# Patient Record
Sex: Male | Born: 1950
Health system: Southern US, Community
[De-identification: ages and names within clinical notes are randomized; demographics above are authoritative.]

## PROBLEM LIST (undated history)

## (undated) DIAGNOSIS — Z5189 Encounter for other specified aftercare: Secondary | ICD-10-CM

## (undated) DIAGNOSIS — E119 Type 2 diabetes mellitus without complications: Secondary | ICD-10-CM

## (undated) DIAGNOSIS — H269 Unspecified cataract: Secondary | ICD-10-CM

## (undated) DIAGNOSIS — K760 Fatty (change of) liver, not elsewhere classified: Secondary | ICD-10-CM

## (undated) DIAGNOSIS — I10 Essential (primary) hypertension: Secondary | ICD-10-CM

## (undated) DIAGNOSIS — T7840XA Allergy, unspecified, initial encounter: Secondary | ICD-10-CM

## (undated) DIAGNOSIS — E785 Hyperlipidemia, unspecified: Secondary | ICD-10-CM

## (undated) HISTORY — DX: Essential (primary) hypertension: I10

## (undated) HISTORY — PX: NASAL SINUS SURGERY: SHX719

## (undated) HISTORY — PX: EYE SURGERY: SHX253

## (undated) HISTORY — PX: HEMORROIDECTOMY: SUR656

## (undated) HISTORY — PX: OTHER SURGICAL HISTORY: SHX169

## (undated) HISTORY — DX: Allergy, unspecified, initial encounter: T78.40XA

## (undated) HISTORY — PX: ABDOMINAL SURGERY: SHX537

## (undated) HISTORY — DX: Type 2 diabetes mellitus without complications: E11.9

## (undated) HISTORY — PX: POLYPECTOMY: SHX149

## (undated) HISTORY — DX: Fatty (change of) liver, not elsewhere classified: K76.0

## (undated) HISTORY — DX: Encounter for other specified aftercare: Z51.89

## (undated) HISTORY — DX: Unspecified cataract: H26.9

## (undated) HISTORY — DX: Hyperlipidemia, unspecified: E78.5

---

## 2000-10-21 ENCOUNTER — Emergency Department (HOSPITAL_COMMUNITY): Admission: EM | Admit: 2000-10-21 | Discharge: 2000-10-21 | Payer: Self-pay | Admitting: Emergency Medicine

## 2000-10-21 ENCOUNTER — Encounter: Payer: Self-pay | Admitting: Emergency Medicine

## 2005-03-17 HISTORY — PX: COLONOSCOPY: SHX174

## 2005-04-06 ENCOUNTER — Ambulatory Visit (HOSPITAL_COMMUNITY): Admission: RE | Admit: 2005-04-06 | Discharge: 2005-04-06 | Payer: Self-pay | Admitting: Surgery

## 2005-04-06 ENCOUNTER — Encounter (INDEPENDENT_AMBULATORY_CARE_PROVIDER_SITE_OTHER): Payer: Self-pay | Admitting: Specialist

## 2005-04-07 ENCOUNTER — Ambulatory Visit (HOSPITAL_COMMUNITY): Admission: RE | Admit: 2005-04-07 | Discharge: 2005-04-07 | Payer: Self-pay | Admitting: *Deleted

## 2005-04-07 ENCOUNTER — Encounter (INDEPENDENT_AMBULATORY_CARE_PROVIDER_SITE_OTHER): Payer: Self-pay | Admitting: Specialist

## 2005-04-08 ENCOUNTER — Emergency Department (HOSPITAL_COMMUNITY): Admission: EM | Admit: 2005-04-08 | Discharge: 2005-04-08 | Payer: Self-pay | Admitting: Emergency Medicine

## 2005-04-10 ENCOUNTER — Ambulatory Visit (HOSPITAL_COMMUNITY): Admission: RE | Admit: 2005-04-10 | Discharge: 2005-04-10 | Payer: Self-pay | Admitting: *Deleted

## 2006-02-26 ENCOUNTER — Ambulatory Visit (HOSPITAL_COMMUNITY): Admission: RE | Admit: 2006-02-26 | Discharge: 2006-02-26 | Payer: Self-pay | Admitting: Family Medicine

## 2007-12-07 ENCOUNTER — Encounter: Payer: Self-pay | Admitting: Internal Medicine

## 2008-02-09 ENCOUNTER — Encounter: Payer: Self-pay | Admitting: Internal Medicine

## 2008-04-26 ENCOUNTER — Encounter: Payer: Self-pay | Admitting: Internal Medicine

## 2008-05-07 ENCOUNTER — Ambulatory Visit: Payer: Self-pay | Admitting: Internal Medicine

## 2008-05-07 DIAGNOSIS — I10 Essential (primary) hypertension: Secondary | ICD-10-CM | POA: Insufficient documentation

## 2008-05-07 DIAGNOSIS — E785 Hyperlipidemia, unspecified: Secondary | ICD-10-CM | POA: Insufficient documentation

## 2008-05-07 DIAGNOSIS — F528 Other sexual dysfunction not due to a substance or known physiological condition: Secondary | ICD-10-CM | POA: Insufficient documentation

## 2008-05-07 DIAGNOSIS — N529 Male erectile dysfunction, unspecified: Secondary | ICD-10-CM | POA: Insufficient documentation

## 2008-05-07 DIAGNOSIS — E119 Type 2 diabetes mellitus without complications: Secondary | ICD-10-CM | POA: Insufficient documentation

## 2008-05-07 LAB — CONVERTED CEMR LAB
Cholesterol, target level: 200 mg/dL
HDL goal, serum: 40 mg/dL
LDL Goal: 100 mg/dL

## 2008-05-11 ENCOUNTER — Telehealth: Payer: Self-pay | Admitting: Internal Medicine

## 2008-07-27 ENCOUNTER — Ambulatory Visit: Payer: Self-pay | Admitting: Internal Medicine

## 2008-07-27 DIAGNOSIS — R0989 Other specified symptoms and signs involving the circulatory and respiratory systems: Secondary | ICD-10-CM | POA: Insufficient documentation

## 2008-07-27 DIAGNOSIS — R0609 Other forms of dyspnea: Secondary | ICD-10-CM | POA: Insufficient documentation

## 2008-07-27 DIAGNOSIS — R9431 Abnormal electrocardiogram [ECG] [EKG]: Secondary | ICD-10-CM | POA: Insufficient documentation

## 2008-07-27 LAB — CONVERTED CEMR LAB
ALT: 34 units/L (ref 0–53)
AST: 30 units/L (ref 0–37)
Albumin: 4.4 g/dL (ref 3.5–5.2)
Alkaline Phosphatase: 80 units/L (ref 39–117)
BUN: 19 mg/dL (ref 6–23)
Basophils Absolute: 0 10*3/uL (ref 0.0–0.1)
Basophils Relative: 0.4 % (ref 0.0–3.0)
Bilirubin Urine: NEGATIVE
Bilirubin, Direct: 0.2 mg/dL (ref 0.0–0.3)
CO2: 32 meq/L (ref 19–32)
Calcium: 9.7 mg/dL (ref 8.4–10.5)
Chloride: 98 meq/L (ref 96–112)
Cholesterol: 193 mg/dL (ref 0–200)
Creatinine, Ser: 0.9 mg/dL (ref 0.4–1.5)
Crystals: NEGATIVE
Direct LDL: 72.7 mg/dL
Eosinophils Absolute: 0.1 10*3/uL (ref 0.0–0.7)
Eosinophils Relative: 2.9 % (ref 0.0–5.0)
GFR calc Af Amer: 111 mL/min
GFR calc non Af Amer: 92 mL/min
Glucose, Bld: 271 mg/dL — ABNORMAL HIGH (ref 70–99)
HCT: 47 % (ref 39.0–52.0)
HDL: 28.1 mg/dL — ABNORMAL LOW (ref >39.0)
Hemoglobin, Urine: NEGATIVE
Hemoglobin: 16.7 g/dL (ref 13.0–17.0)
Hgb A1c MFr Bld: 9.1 % — ABNORMAL HIGH (ref 4.6–6.0)
Ketones, ur: NEGATIVE mg/dL
Leukocytes, UA: NEGATIVE
Lymphocytes Relative: 24.6 % (ref 12.0–46.0)
MCHC: 35.7 g/dL (ref 30.0–36.0)
MCV: 94 fL (ref 78.0–100.0)
Monocytes Absolute: 0.4 10*3/uL (ref 0.1–1.0)
Monocytes Relative: 10.8 % (ref 3.0–12.0)
Neutro Abs: 2.1 10*3/uL (ref 1.4–7.7)
Neutrophils Relative %: 61.3 % (ref 43.0–77.0)
Nitrite: NEGATIVE
Platelets: 185 10*3/uL (ref 150–400)
Potassium: 4.5 meq/L (ref 3.5–5.1)
RBC / HPF: NONE SEEN
RBC: 4.99 M/uL (ref 4.22–5.81)
RDW: 11.3 % — ABNORMAL LOW (ref 11.5–14.6)
Sodium: 136 meq/L (ref 135–145)
Specific Gravity, Urine: 1.025 (ref 1.000–1.03)
TSH: 1.98 microintl units/mL (ref 0.35–5.50)
Total Bilirubin: 1.8 mg/dL — ABNORMAL HIGH (ref 0.3–1.2)
Total CHOL/HDL Ratio: 6.9
Total Protein, Urine: NEGATIVE mg/dL
Total Protein: 7.4 g/dL (ref 6.0–8.3)
Triglycerides: 523 mg/dL (ref 0–149)
Urine Glucose: 500 mg/dL — AB
Urobilinogen, UA: 0.2 (ref 0.0–1.0)
VLDL: 105 mg/dL — ABNORMAL HIGH (ref 0–40)
WBC: 3.5 10*3/uL — ABNORMAL LOW (ref 4.5–10.5)
pH: 5.5 (ref 5.0–8.0)

## 2008-07-30 ENCOUNTER — Encounter: Payer: Self-pay | Admitting: Internal Medicine

## 2008-08-02 ENCOUNTER — Ambulatory Visit: Payer: Self-pay

## 2008-08-07 ENCOUNTER — Encounter: Payer: Self-pay | Admitting: Internal Medicine

## 2008-08-22 ENCOUNTER — Ambulatory Visit: Payer: Self-pay | Admitting: Internal Medicine

## 2008-08-22 DIAGNOSIS — E8881 Metabolic syndrome: Secondary | ICD-10-CM | POA: Insufficient documentation

## 2011-01-02 NOTE — Op Note (Signed)
Brian [name], Brian [name]             ACCOUNT NO.:  [id number]   MEDICAL RECORD NO.:  [id number]          PATIENT TYPE:  AMB   LOCATION:  ENDO                         FACILITY:  [facility]   PHYSICIAN:  Sandria Bales. Ezzard Standing, M.D.  DATE OF BIRTH:  May 21, 1951   DATE OF PROCEDURE:  04/06/2005  DATE OF DISCHARGE:                                 OPERATIVE REPORT   PREOPERATIVE DIAGNOSES:  1.  Colorectal screening, history of hemorrhoids/bleeding.  2.  Family history of colon cancer.   POSTOPERATIVE DIAGNOSIS:  Colonic polyp at 20 cm, otherwise normal  colonoscopy.   SURGEON:  Dr. Ezzard Standing   ANESTHESIA:  Versed 4 mg, 50 mg Demerol.   COMPLICATIONS:  None.   INDICATION FOR PROCEDURE:  Brian [name] is a 60 year old white male, who is  scheduled to undergo a PPH procedure for hemorrhoids tomorrow by Dr. Danna Hefty.  He has had some bleeding with these hemorrhoids.  He has never  had a prior colonoscopy, and he has a brother who had colon cancer, so he is  for colonic screening for multiple reasons.   He completed a  Half LYTELY bowel prep at home and now comes to the  endoscopy suite for colonoscopy.   OPERATIVE NOTE:  An IV was started in his right wrist.  He was monitored with pulse oximetry,  EKG, and blood pressure cuff and has nasal O2 at 2 L a minute flowing during  the procedure.   The patient was in left lateral decubitus position.  He was given 4 mg of  Versed, 50 mg of Demerol at the initiation of the procedure.  Flexible  Olympus colonoscope was passed without difficulty around to his cecum.  The  colon was prep was good except for the cecum.  The last 10 cm of the  colon/cecum had a sticky, thick paste of stool on the colonic wall.  I used  between 10-15 syringes of fluid to irrigate this off and I thought I got a  good view of the mucosa.  He had a widely opened appendiceal orifice.  His  ileocecal valve was somewhat fatty-appearing, but I saw no mucosal lesions  in the  right colon, transverse colon, or left colon.  At 20 cm from the anal  verge, he had an approximate 5-6 mm polyp as a single polyp identified, and  this was removed with hot biopsy forceps and specimen sent to pathology.   The remainder of the sigmoid colon was unremarkable.  The scope was  retroflexed within the rectum.  I could see internal hemorrhoids but  otherwise was unremarkable.   IMPRESSION:  He has a benign-appearing polyp at 20 cm pending final  pathology.  Because of his family history now with this polyp, I would  recommend repeat colonoscopy in 5 years unless the polyp shows something  more ominous.      Sandria Bales. Ezzard Standing, M.D.  Electronically Signed     DHN/MEDQ  D:  04/06/2005  T:  04/06/2005  Job:  [id number]   cc:   Windle Guard, M.D.  [address]  [place], [place]  [address]  Fax: [phone]   Vikki Ports, MD  [address]  [place]  [place] [address]

## 2011-01-02 NOTE — Op Note (Signed)
NAMECLEVELAND, YARBRO             ACCOUNT NO.:  [id number]   MEDICAL RECORD NO.:  [id number]          PATIENT TYPE:  OUT   LOCATION:  XRAY                         FACILITY:  [facility]   PHYSICIAN:  Vikki Ports, MDDATE OF BIRTH:  [birth year]   DATE OF PROCEDURE:  DATE OF DISCHARGE:  04/10/2005                                 OPERATIVE REPORT   PREOPERATIVE DIAGNOSIS:  Internal hemorrhoids, grade 2 and 3.   POSTOPERATIVE DIAGNOSIS:  Internal hemorrhoids, grade 2 and 3.   PROCEDURE:  Procedure for prolapsing hemorrhoids, rectopexy.   SURGEON:  Vikki Ports, M.D.   ANESTHESIA:  General.   DESCRIPTION:  Patient was taken to the operating room and placed in a supine  position.  After adequate general anesthesia was induced, patient was placed  in the prone jack-knife position.  Perianal and rectal prep were undertaken.  The anal dilatation was accomplished to three fingers, and the internal  hemorrhoidal bundles were injected with Marcaine and Wydase.  Internal and  external sphincter muscles were injected with additional Marcaine.  Prolene  2-0 in the mucosal and a submucosal purse-string suture was placed 5 cm  proximal to the dentate line, and the stapler was introduced.  Purse-string  suture was tied down.  The stapler was then closed, held in place for one  minute, and fired.  The staple line was inspected.  There was no evidence of  bleeding.  Gelfoam packing was placed.  No additional pathology was noted.  Patient was taken to the PACU in good condition.      Vikki Ports, MD  Electronically Signed     KRH/MEDQ  D:  04/15/2005  T:  04/15/2005  Job:  [id number]

## 2013-11-02 ENCOUNTER — Other Ambulatory Visit (HOSPITAL_COMMUNITY): Payer: Self-pay | Admitting: Specialist

## 2013-11-02 ENCOUNTER — Ambulatory Visit (HOSPITAL_COMMUNITY)
Admission: RE | Admit: 2013-11-02 | Discharge: 2013-11-02 | Disposition: A | Discharge status: Home / Self Care | Payer: Worker's Compensation | Source: Ambulatory Visit | Attending: Specialist | Admitting: Specialist

## 2013-11-02 DIAGNOSIS — Z135 Encounter for screening for eye and ear disorders: Secondary | ICD-10-CM | POA: Insufficient documentation

## 2013-11-02 DIAGNOSIS — T1590XA Foreign body on external eye, part unspecified, unspecified eye, initial encounter: Secondary | ICD-10-CM

## 2015-10-04 ENCOUNTER — Encounter: Payer: Self-pay | Admitting: Gastroenterology

## 2015-11-22 ENCOUNTER — Ambulatory Visit (AMBULATORY_SURGERY_CENTER): Payer: Self-pay | Admitting: *Deleted

## 2015-11-22 VITALS — Wt 280.2 lb

## 2015-11-22 DIAGNOSIS — Z86010 Personal history of colonic polyps: Secondary | ICD-10-CM

## 2015-11-22 NOTE — Progress Notes (Signed)
Patient denies any allergies to egg or soy products. Patient denies complications with anesthesia/sedation.  Patient denies oxygen use at home and denies diet medications. Emmi instructions for colonoscopy explained but patient denied.     

## 2015-12-06 ENCOUNTER — Encounter: Payer: Self-pay | Admitting: Gastroenterology

## 2015-12-06 ENCOUNTER — Ambulatory Visit (AMBULATORY_SURGERY_CENTER): Payer: BLUE CROSS/BLUE SHIELD | Admitting: Gastroenterology

## 2015-12-06 VITALS — BP 131/87 | HR 70 | Temp 98.6°F | Resp 11 | Ht 72.0 in | Wt 280.0 lb

## 2015-12-06 DIAGNOSIS — D122 Benign neoplasm of ascending colon: Secondary | ICD-10-CM

## 2015-12-06 DIAGNOSIS — Z86010 Personal history of colonic polyps: Secondary | ICD-10-CM

## 2015-12-06 MED ORDER — SODIUM CHLORIDE 0.9 % IV SOLN: 500.0000 mL | INTRAVENOUS | Status: DC

## 2015-12-06 NOTE — Progress Notes (Signed)
To pacu vss patent aw report to rn 

## 2015-12-06 NOTE — Op Note (Signed)
[place] Patient Name: Brian [name] Procedure Date: 12/06/2015 10:08 AM MRN: [id number] Endoscopist: [place]. Loletha Carrow , MD Age: 65 Date of Birth: 03-Jul-1951 Gender: Male Procedure:                Colonoscopy Indications:              Screening for colorectal malignant neoplasm Medicines:                Monitored Anesthesia Care Procedure:                Pre-Anesthesia Assessment:                           - Prior to the procedure, a History and Physical                            was performed, and patient medications and                            allergies were reviewed. The patient's tolerance of                            previous anesthesia was also reviewed. The risks                            and benefits of the procedure and the sedation                            options and risks were discussed with the patient.                            All questions were answered, and informed consent                            was obtained. Prior Anticoagulants: The patient has                            taken aspirin, last dose was 1 day prior to                            procedure. ASA Grade Assessment: II - A patient                            with mild systemic disease. After reviewing the                            risks and benefits, the patient was deemed in                            satisfactory condition to undergo the procedure.                           After obtaining informed consent, the colonoscope  was passed under direct vision. Throughout the                            procedure, the patient's blood pressure, pulse, and                            oxygen saturations were monitored continuously. The                            Model CF-HQ190L [id number]) scope was introduced                            through the anus and advanced to the the cecum,                            identified by appendiceal orifice and ileocecal                             valve. The colonoscopy was performed without                            difficulty. The patient tolerated the procedure                            well. The quality of the bowel preparation was                            good. The ileocecal valve, appendiceal orifice, and                            rectum were photographed. The quality of the bowel                            preparation was evaluated using the BBPS [facility]                            Bowel Preparation Scale) with scores of: Right                            Colon = 2, Transverse Colon = 2 and Left Colon = 2.                            The total BBPS score equals 6. The bowel                            preparation used was Miralax. Scope In: 10:36:27 AM Scope Out: 10:49:33 AM Scope Withdrawal Time: 0 hours 10 minutes 51 seconds  Total Procedure Duration: 0 hours 13 minutes 6 seconds  Findings:                 The perianal and digital rectal examinations were                            normal.  A 4 mm polyp was found in the proximal ascending                            colon. The polyp was sessile. The polyp was removed                            with a cold snare. The polyp was removed with a                            piecemeal technique using a cold snare. Resection                            and retrieval were complete.                           Multiple small-mouthed diverticula were found in                            the sigmoid colon.                           Internal hemorrhoids were found during                            retroflexion. The hemorrhoids were Grade I                            (internal hemorrhoids that do not prolapse).                           There was evidence of a prior hemorrhoidectomy in                            the rectum. Complications:            No immediate complications. Estimated Blood Loss:     Estimated blood loss: none. Impression:                - One 4 mm polyp in the proximal ascending colon,                            removed with a cold snare and removed piecemeal                            using a cold snare. Resected and retrieved.                           - Diverticulosis in the sigmoid colon.                           - Internal hemorrhoids.                           - Hemorrhoidectomy anastomosis. Recommendation:           - Patient has a contact number available for  emergencies. The signs and symptoms of potential                            delayed complications were discussed with the                            patient. Return to normal activities tomorrow.                            Written discharge instructions were provided to the                            patient.                           - Resume previous diet.                           - Continue present medications.                           - Await pathology results.                           - Repeat colonoscopy is recommended for                            surveillance. The colonoscopy date will be                            determined after pathology results from today's                            exam become available for review.                           - Patient has a contact number available for                            emergencies. The signs and symptoms of potential                            delayed complications were discussed with the                            patient. Return to normal activities tomorrow.                            Written discharge instructions were provided to the                            patient.                           - No aspirin, ibuprofen, naproxen, or other  non-steroidal anti-inflammatory drugs for 5 days                            after polyp removal. Mallie Mussel L. Loletha Carrow, MD 12/06/2015 10:55:52 AM This report has been signed electronically.

## 2015-12-06 NOTE — Patient Instructions (Signed)
YOU HAD AN ENDOSCOPIC PROCEDURE TODAY AT [facility]:   Refer to the procedure report that was given to you for any specific questions about what was found during the examination.  If the procedure report does not answer your questions, please call your gastroenterologist to clarify.  If you requested that your care partner not be given the details of your procedure findings, then the procedure report has been included in a sealed envelope for you to review at your convenience later.  YOU SHOULD EXPECT: Some feelings of bloating in the abdomen. Passage of more gas than usual.  Walking can help get rid of the air that was put into your GI tract during the procedure and reduce the bloating. If you had a lower endoscopy (such as a colonoscopy or flexible sigmoidoscopy) you may notice spotting of blood in your stool or on the toilet paper. If you underwent a bowel prep for your procedure, you may not have a normal bowel movement for a few days.  Please Note:  You might notice some irritation and congestion in your nose or some drainage.  This is from the oxygen used during your procedure.  There is no need for concern and it should clear up in a day or so.  SYMPTOMS TO REPORT IMMEDIATELY:   Following lower endoscopy (colonoscopy or flexible sigmoidoscopy):  Excessive amounts of blood in the stool  Significant tenderness or worsening of abdominal pains  Swelling of the abdomen that is new, acute  Fever of 100F or higher   For urgent or emergent issues, a gastroenterologist can be reached at any hour by calling [phone].   DIET: Your first meal following the procedure should be a small meal and then it is ok to progress to your normal diet. Heavy or fried foods are harder to digest and may make you feel nauseous or bloated.  Likewise, meals heavy in dairy and vegetables can increase bloating.  Drink plenty of fluids but you should avoid alcoholic beverages for 24  hours.  ACTIVITY:  You should plan to take it easy for the rest of today and you should NOT DRIVE or use heavy machinery until tomorrow (because of the sedation medicines used during the test).    FOLLOW UP: Our staff will call the number listed on your records the next business day following your procedure to check on you and address any questions or concerns that you may have regarding the information given to you following your procedure. If we do not reach you, we will leave a message.  However, if you are feeling well and you are not experiencing any problems, there is no need to return our call.  We will assume that you have returned to your regular daily activities without incident.  If any biopsies were taken you will be contacted by phone or by letter within the next 1-3 weeks.  Please call us at [phone] if you have not heard about the biopsies in 3 weeks.    SIGNATURES/CONFIDENTIALITY: You and/or your care partner have signed paperwork which will be entered into your electronic medical record.  These signatures attest to the fact that that the information above on your After Visit Summary has been reviewed and is understood.  Full responsibility of the confidentiality of this discharge information lies with you and/or your care-partner.  Polyps, hemorrhoids,diverticulosis-handouts given  Repeat colonoscopy will be determined by pathology.  No aspirin or NSAIDs for 5 days (ibuprofen, motrin, aleve, advil, naproxen,  etc)

## 2015-12-06 NOTE — Progress Notes (Signed)
Called to room to assist during endoscopic procedure.  Patient ID and intended procedure confirmed with present staff. Received instructions for my participation in the procedure from the performing physician.  

## 2015-12-09 ENCOUNTER — Telehealth: Payer: Self-pay

## 2015-12-09 NOTE — Telephone Encounter (Signed)
  Follow up Call-  Call back number 12/06/2015  Post procedure Call Back phone  # [phone]  Permission to leave phone message Yes     Patient questions:  Do you have a fever, pain , or abdominal swelling? No. Pain Score  0 *  Have you tolerated food without any problems? Yes.    Have you been able to return to your normal activities? Yes.    Do you have any questions about your discharge instructions: Diet   No. Medications  No. Follow up visit  No.  Do you have questions or concerns about your Care? No.  Actions: * If pain score is 4 or above: No action needed, pain <4.  No problems noted per the pt. maw

## 2015-12-10 ENCOUNTER — Encounter: Payer: Self-pay | Admitting: Gastroenterology

## 2018-03-08 DIAGNOSIS — Z8249 Family history of ischemic heart disease and other diseases of the circulatory system: Secondary | ICD-10-CM | POA: Diagnosis not present

## 2018-03-08 DIAGNOSIS — Z7984 Long term (current) use of oral hypoglycemic drugs: Secondary | ICD-10-CM | POA: Diagnosis not present

## 2018-03-08 DIAGNOSIS — Z7982 Long term (current) use of aspirin: Secondary | ICD-10-CM | POA: Diagnosis not present

## 2018-03-08 DIAGNOSIS — E119 Type 2 diabetes mellitus without complications: Secondary | ICD-10-CM | POA: Diagnosis not present

## 2018-03-08 DIAGNOSIS — Z6835 Body mass index (BMI) 35.0-35.9, adult: Secondary | ICD-10-CM | POA: Diagnosis not present

## 2018-03-08 DIAGNOSIS — N529 Male erectile dysfunction, unspecified: Secondary | ICD-10-CM | POA: Diagnosis not present

## 2018-03-08 DIAGNOSIS — J309 Allergic rhinitis, unspecified: Secondary | ICD-10-CM | POA: Diagnosis not present

## 2018-03-08 DIAGNOSIS — Z833 Family history of diabetes mellitus: Secondary | ICD-10-CM | POA: Diagnosis not present

## 2018-03-08 DIAGNOSIS — I10 Essential (primary) hypertension: Secondary | ICD-10-CM | POA: Diagnosis not present

## 2018-03-08 DIAGNOSIS — E669 Obesity, unspecified: Secondary | ICD-10-CM | POA: Diagnosis not present

## 2018-03-28 ENCOUNTER — Ambulatory Visit (INDEPENDENT_AMBULATORY_CARE_PROVIDER_SITE_OTHER): Payer: Medicare HMO | Admitting: Family

## 2018-03-28 ENCOUNTER — Other Ambulatory Visit (INDEPENDENT_AMBULATORY_CARE_PROVIDER_SITE_OTHER): Payer: Medicare HMO

## 2018-03-28 ENCOUNTER — Encounter: Payer: Self-pay | Admitting: Family

## 2018-03-28 VITALS — BP 130/82 | HR 70 | Temp 97.9°F | Ht 72.0 in | Wt 275.1 lb

## 2018-03-28 DIAGNOSIS — J309 Allergic rhinitis, unspecified: Secondary | ICD-10-CM | POA: Diagnosis not present

## 2018-03-28 DIAGNOSIS — Z125 Encounter for screening for malignant neoplasm of prostate: Secondary | ICD-10-CM

## 2018-03-28 DIAGNOSIS — R200 Anesthesia of skin: Secondary | ICD-10-CM

## 2018-03-28 DIAGNOSIS — R202 Paresthesia of skin: Secondary | ICD-10-CM

## 2018-03-28 DIAGNOSIS — E119 Type 2 diabetes mellitus without complications: Secondary | ICD-10-CM

## 2018-03-28 DIAGNOSIS — I10 Essential (primary) hypertension: Secondary | ICD-10-CM

## 2018-03-28 LAB — COMPREHENSIVE METABOLIC PANEL
ALT: 38 U/L (ref 0–53)
AST: 33 U/L (ref 0–37)
Albumin: 4.5 g/dL (ref 3.5–5.2)
Alkaline Phosphatase: 80 U/L (ref 39–117)
BILIRUBIN TOTAL: 1.5 mg/dL — AB (ref 0.2–1.2)
BUN: 18 mg/dL (ref 6–23)
CHLORIDE: 103 meq/L (ref 96–112)
CO2: 27 mEq/L (ref 19–32)
CREATININE: 1.06 mg/dL (ref 0.40–1.50)
Calcium: 9.5 mg/dL (ref 8.4–10.5)
GFR: 73.59 mL/min (ref >60.00)
GLUCOSE: 150 mg/dL — AB (ref 70–99)
Potassium: 4.3 mEq/L (ref 3.5–5.1)
SODIUM: 138 meq/L (ref 135–145)
Total Protein: 7.1 g/dL (ref 6.0–8.3)

## 2018-03-28 LAB — LIPID PANEL
CHOL/HDL RATIO: 6
Cholesterol: 146 mg/dL (ref 0–200)
HDL: 26 mg/dL — ABNORMAL LOW (ref >39.00)
NONHDL: 119.64
Triglycerides: 248 mg/dL — ABNORMAL HIGH (ref 0.0–149.0)
VLDL: 49.6 mg/dL — AB (ref 0.0–40.0)

## 2018-03-28 LAB — HEMOGLOBIN A1C: Hgb A1c MFr Bld: 6.7 % — ABNORMAL HIGH (ref 4.6–6.5)

## 2018-03-28 LAB — CBC WITH DIFFERENTIAL/PLATELET
BASOS ABS: 0 10*3/uL (ref 0.0–0.1)
BASOS PCT: 0.6 % (ref 0.0–3.0)
EOS ABS: 0.3 10*3/uL (ref 0.0–0.7)
Eosinophils Relative: 5.3 % — ABNORMAL HIGH (ref 0.0–5.0)
HEMATOCRIT: 45.3 % (ref 39.0–52.0)
Hemoglobin: 16 g/dL (ref 13.0–17.0)
LYMPHS ABS: 1 10*3/uL (ref 0.7–4.0)
Lymphocytes Relative: 20.9 % (ref 12.0–46.0)
MCHC: 35.4 g/dL (ref 30.0–36.0)
MCV: 91.9 fl (ref 78.0–100.0)
Monocytes Absolute: 0.6 10*3/uL (ref 0.1–1.0)
Monocytes Relative: 12.9 % — ABNORMAL HIGH (ref 3.0–12.0)
NEUTROS PCT: 60.3 % (ref 43.0–77.0)
Neutro Abs: 2.9 10*3/uL (ref 1.4–7.7)
PLATELETS: 205 10*3/uL (ref 150.0–400.0)
RBC: 4.92 Mil/uL (ref 4.22–5.81)
RDW: 13.1 % (ref 11.5–15.5)
WBC: 4.8 10*3/uL (ref 4.0–10.5)

## 2018-03-28 LAB — PSA: PSA: 1.2 ng/mL (ref 0.10–4.00)

## 2018-03-28 LAB — LDL CHOLESTEROL, DIRECT: Direct LDL: 87 mg/dL

## 2018-03-28 LAB — VITAMIN B12: VITAMIN B 12: 633 pg/mL (ref 211–911)

## 2018-03-28 MED ORDER — ZOSTER VAC RECOMB ADJUVANTED 50 MCG/0.5ML IM SUSR: 0.5000 mL | Freq: Once | INTRAMUSCULAR | 1 refills | Status: AC

## 2018-03-28 NOTE — Patient Instructions (Signed)
Please contact [employer] and get me type/ date of your pneumonia shots;

## 2018-03-28 NOTE — Progress Notes (Signed)
Brian [name] is a 67 y.o. male with the following history as recorded in EpicCare:  Patient Active Problem List   Diagnosis Date Noted  . DYSPNEA ON EXERTION 07/27/2008  . ELECTROCARDIOGRAM, ABNORMAL 07/27/2008  . DIABETES-TYPE 2 05/07/2008  . HYPERLIPIDEMIA 05/07/2008  . ERECTILE DYSFUNCTION 05/07/2008  . HYPERTENSION, BENIGN ESSENTIAL 05/07/2008    Current Outpatient Medications  Medication Sig Dispense Refill  . aspirin 81 MG chewable tablet Chew 81 mg by mouth daily.    Marland Kitchen atorvastatin (LIPITOR) 20 MG tablet Take 20 mg by mouth daily.    Marland Kitchen azelastine (ASTELIN) 0.1 % nasal spray Place into both nostrils 2 (two) times daily. Use in each nostril as directed    . carvedilol (COREG) 6.25 MG tablet     . fluticasone (FLONASE) 50 MCG/ACT nasal spray USE 2 SPRAY(S) IN EACH NOSTRIL ONCE DAILY  1  . fluticasone (FLONASE) 50 MCG/ACT nasal spray Place into both nostrils daily.    Marland Kitchen glimepiride (AMARYL) 1 MG tablet Take 1 mg by mouth daily with breakfast.    . hydrochlorothiazide (HYDRODIURIL) 25 MG tablet Take 25 mg by mouth daily. for high blood pressure  0  . levocetirizine (XYZAL) 5 MG tablet TAKE 1 TABLET BY MOUTH ONCE DAILY FOR ALLERGIES  0  . metFORMIN (GLUCOPHAGE) 1000 MG tablet Take 1,000 mg by mouth 2 (two) times daily with a meal.    . Multiple Vitamin (MULTIVITAMIN WITH MINERALS) TABS tablet Take 1 tablet by mouth daily.    . Omega-3 Fatty Acids (FISH OIL) 1000 MG CAPS Take 1,000 mg by mouth daily.    . sildenafil (REVATIO) 20 MG tablet Take 20 mg by mouth daily as needed.    . vitamin B-12 (CYANOCOBALAMIN) 1000 MCG tablet Take 1,000 mcg by mouth daily.    Marland Kitchen Zoster Vaccine Adjuvanted [facility]) injection Inject 0.5 mLs into the muscle once for 1 dose. Repeat in 6 months 0.5 mL 1   No current facility-administered medications for this visit.     Allergies: Patient has no known allergies.  Past Medical History:  Diagnosis Date  . Blood transfusion without reported  diagnosis    with nasal sinus surgery  . Hyperlipidemia   . Hypertension     Past Surgical History:  Procedure Laterality Date  . ABDOMINAL SURGERY     scrapnel removed   . COLONOSCOPY  03/2005   Alphonsa Overall, polyp  . EYE SURGERY     x 7 removed metal from eyes - shrapnel  . HEMORROIDECTOMY    . left shoulder surgery    . NASAL SINUS SURGERY    . right shoulder surgery      Family History  Problem Relation Age of Onset  . Colon cancer Neg Hx   . Colon polyps Neg Hx   . Esophageal cancer Neg Hx   . Rectal cancer Neg Hx   . Stomach cancer Neg Hx   . Diabetes Sister   . Heart disease Brother     Social History   Tobacco Use  . Smoking status: Former Smoker    Packs/day: 5.00    Years: 4.00    Pack years: 20.00    Types: Cigarettes    Last attempt to quit: 08/17/1986    Years since quitting: 31.6  . Smokeless tobacco: Never Used  Substance Use Topics  . 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 History of hypertension, hyperlipidemia, Type 2 Diabetes; Denies any chest pain, shortness of breath, blurred vision or headache. Had home medicare CPE recently and blood pressure was well controlled; In baseline state of health today with no concerns; will need refills on medications in about 3 weeks and will have pharmacy send requests here.   Objective:  Vitals:   03/28/18 0911 03/28/18 0943  BP: (!) 148/80 130/82  Pulse: 70   Temp: 97.9 F (36.6 C)   TempSrc: Oral   SpO2: 94%   Weight: 275 lb 1.3 oz (124.8 kg)   Height: 6' (1.829 m)     General: Well developed, well nourished, in no acute distress  Skin : Warm and dry. Superficial varicosities noted on both lower legs Head: Normocephalic and atraumatic  Eyes: Sclera and conjunctiva clear; pupils round and  reactive to light; extraocular movements intact  Ears: External normal; canals clear; tympanic membranes normal  Oropharynx: Pink, supple. No suspicious lesions  Neck: Supple without thyromegaly, adenopathy  Lungs: Respirations unlabored; clear to auscultation bilaterally without wheeze, rales, rhonchi  CVS exam: normal rate and regular rhythm.  Abdomen: Soft; nontender; nondistended; normoactive bowel sounds; no masses or hepatosplenomegaly  Musculoskeletal: No deformities; no active joint inflammation  Extremities: No edema, cyanosis, clubbing  Vessels: Symmetric bilaterally  Neurologic: Alert and oriented; speech intact; face symmetrical; moves all extremities well; CNII-XII intact without focal deficit   Assessment:  1. Essential hypertension   2. Controlled type 2 diabetes mellitus without complication, without long-term current use of insulin ([place])   3. Allergic rhinitis, unspecified seasonality, unspecified trigger   4. Numbness and tingling   5. Prostate cancer screening     Plan:  1. Stable; normal blood pressure on re-check here today; continue same medications; follow-up in 6 months; check CBC, CMP today; 2. Last Hgba1c at 6.6; update Hgba1c, lipid panel today; 3. Stable on combination of Xyzal, Flonase and Astelin; 4. Check B12 level today;  5. Check PSA today;  Order given to get Shingrix at his pharmacy; he will check on status of his pneumonia vaccines and get that information to [place].   Return in about 6 months (around 09/28/2018).  Orders Placed This Encounter  Procedures  . CBC w/Diff    Standing Status:   Future    Number of Occurrences:   1    Standing Expiration Date:   03/28/2019  . Comp Met (CMET)    Standing Status:   Future    Number of Occurrences:   1    Standing Expiration Date:   03/28/2019  . Lipid panel    Standing Status:   Future    Number of Occurrences:   1    Standing Expiration Date:   03/29/2019  . HgB A1c    Standing Status:   Future     Number of Occurrences:   1    Standing Expiration Date:   03/28/2019  . PSA    Standing Status:   Future    Number of Occurrences:   1    Standing Expiration Date:   03/28/2019  . B12    Standing Status:   Future    Number of Occurrences:   1    Standing Expiration Date:   03/28/2019    Requested Prescriptions   Signed Prescriptions Disp Refills  . Zoster Vaccine Adjuvanted [facility]) injection 0.5  mL 1    Sig: Inject 0.5 mLs into the muscle once for 1 dose. Repeat in 6 months

## 2018-04-26 ENCOUNTER — Telehealth: Payer: Self-pay | Admitting: Family

## 2018-04-26 NOTE — Telephone Encounter (Signed)
Refill of hydrodiuril and glucophage by historical provider  LOV 03/28/18  [facility] [address], [place]        [phone] (Phone) [phone] (Fax)

## 2018-04-26 NOTE — Telephone Encounter (Signed)
Copied from [place] [id number]. Topic: Quick Communication - Rx Refill/Question >> Apr 26, 2018  4:54 PM Bea Graff, NT wrote: Medication: hydrochlorothiazide (HYDRODIURIL) 25 MG tablet, levocetirizine (XYZAL) 5 MG tablet, and metFORMIN (GLUCOPHAGE) 1000 MG tablet  Has the patient contacted their pharmacy? Yes.   (Agent: If no, request that the patient contact the pharmacy for the refill.) (Agent: If yes, when and what did the pharmacy advise?)  Preferred Pharmacy (with phone number or street name): [facility], [place] [phone] (Phone) [phone] (Fax)    Agent: Please be advised that RX refills may take up to 3 business days. We ask that you follow-up with your pharmacy.

## 2018-04-27 MED ORDER — HYDROCHLOROTHIAZIDE 25 MG PO TABS: 25.0000 mg | ORAL_TABLET | Freq: Every day | ORAL | 1 refills | Status: DC

## 2018-04-27 MED ORDER — METFORMIN HCL 1000 MG PO TABS: 1000.0000 mg | ORAL_TABLET | Freq: Two times a day (BID) | ORAL | 1 refills | Status: DC

## 2018-04-27 MED ORDER — LEVOCETIRIZINE DIHYDROCHLORIDE 5 MG PO TABS: ORAL_TABLET | ORAL | 0 refills | Status: DC

## 2018-04-27 NOTE — Telephone Encounter (Signed)
Reviewed chart pt is new to [facility] & is up-to-date sent refills to pof.Marland KitchenJohny Chess

## 2018-05-16 DIAGNOSIS — R69 Illness, unspecified: Secondary | ICD-10-CM | POA: Diagnosis not present

## 2018-05-30 ENCOUNTER — Telehealth: Payer: Self-pay | Admitting: Family

## 2018-05-30 ENCOUNTER — Other Ambulatory Visit: Payer: Self-pay | Admitting: Family

## 2018-05-30 MED ORDER — GLIMEPIRIDE 1 MG PO TABS: 1.0000 mg | ORAL_TABLET | Freq: Every day | ORAL | 1 refills | Status: DC

## 2018-05-30 NOTE — Telephone Encounter (Signed)
Copied from [place] [id number]. Topic: Quick Communication - Rx Refill/Question >> May 30, 2018  9:38 AM Waylan Rocher, Lumin L wrote: Medication: glimepiride (AMARYL) 1 MG table 90 day supply(was being prescribed by previous pcp, say's he's taking for cholesterol but seemed unsure)  Has the patient contacted their pharmacy? Yes.   (Agent: If no, request that the patient contact the pharmacy for the refill.) (Agent: If yes, when and what did the pharmacy advise?)  Preferred Pharmacy (with phone number or street name): [facility] [address] [facility], [place] [address] Phone: [phone] Fax: [phone]  Agent: Please be advised that RX refills may take up to 3 business days. We ask that you follow-up with your pharmacy.

## 2018-05-31 DIAGNOSIS — R69 Illness, unspecified: Secondary | ICD-10-CM | POA: Diagnosis not present

## 2018-06-07 ENCOUNTER — Telehealth: Payer: Self-pay | Admitting: Family

## 2018-06-07 MED ORDER — GLIMEPIRIDE 1 MG PO TABS: 1.0000 mg | ORAL_TABLET | Freq: Two times a day (BID) | ORAL | 1 refills | Status: DC

## 2018-06-07 NOTE — Telephone Encounter (Signed)
Copied from [place] [id number] [id number]. Topic: Quick Communication - See Telephone Encounter >> Jun 07, 2018 10:57 AM Conception Chancy, NT wrote: CRM for notification. See Telephone encounter for: 06/07/18.  Patient is calling and states he just picked up glimepiride (AMARYL) 1 MG tablet and it is instructed to take 1x a day at breakfast, and he states previously by his other provider it was to take 1 tablet by mouth 2x a day. He would like to clarify this change.

## 2018-06-07 NOTE — Telephone Encounter (Signed)
Will make this correction as requested.

## 2018-06-08 NOTE — Telephone Encounter (Signed)
Spoke with patient and info given 

## 2018-07-13 ENCOUNTER — Other Ambulatory Visit: Payer: Self-pay | Admitting: Family

## 2018-07-13 MED ORDER — FLUTICASONE PROPIONATE 50 MCG/ACT NA SUSP: 2.0000 | Freq: Every day | NASAL | 11 refills | Status: DC

## 2018-07-13 NOTE — Telephone Encounter (Signed)
Requested medication (s) are due for refill today: Amount not specified  Requested medication (s) are on the active medication list: yes    Last refill: 03/28/18  Future visit scheduled yes 09/28/2018  Notes to clinic:Historical provider  Requested Prescriptions  Pending Prescriptions Disp Refills   fluticasone (FLONASE) 50 MCG/ACT nasal spray      Sig: Place into both nostrils daily.     Ear, Nose, and Throat: Nasal Preparations - Corticosteroids Passed - 07/13/2018 10:40 AM      Passed - Valid encounter within last 12 months    Recent Outpatient Visits          3 months ago Essential hypertension   [place], Marvis Repress, [place]      Future Appointments            In 2 months Valere Dross, Mar[facility] Repress, [place], [facility]

## 2018-07-13 NOTE — Telephone Encounter (Signed)
Copied from [place] [phone]. Topic: Quick Communication - Rx Refill/Question >> Jul 13, 2018  9:37 AM Sheran Luz wrote: Medication: fluticasone (FLONASE) 50 MCG/ACT nasal spray and azelastine (ASTELIN) 0.1 % nasal spray  Patient is requesting refills of these medications. Patient states that he has spoken with FNP Jodi Mourning about getting them filled by her at last Ov.   Has the patient contacted their pharmacy? No. Preferred Pharmacy (with phone number or street name):[place], [place]

## 2018-08-03 ENCOUNTER — Other Ambulatory Visit: Payer: Self-pay | Admitting: Family

## 2018-09-20 ENCOUNTER — Other Ambulatory Visit: Payer: Self-pay | Admitting: Family

## 2018-09-20 ENCOUNTER — Telehealth: Payer: Self-pay

## 2018-09-20 MED ORDER — AZELASTINE HCL 0.1 % NA SOLN: 2.0000 | Freq: Two times a day (BID) | NASAL | 11 refills | Status: DC

## 2018-09-20 NOTE — Telephone Encounter (Signed)
Copied from [place] [id number]. Topic: Quick Communication - Rx Refill/Question >> Sep 20, 2018 10:03 AM Carolyn Stare wrote: Medication   azelastine (ASTELIN) 0.1 % nasal spray  this will be the first time Brian [name] is refilling this for pt   Preferred Pharmacy  [facility] [place]   Agent: Please be advised that RX refills may take up to 3 business days. We ask that you follow-up with your pharmacy.

## 2018-09-28 ENCOUNTER — Ambulatory Visit (INDEPENDENT_AMBULATORY_CARE_PROVIDER_SITE_OTHER): Payer: Medicare HMO | Admitting: Family

## 2018-09-28 ENCOUNTER — Encounter: Payer: Self-pay | Admitting: Family

## 2018-09-28 ENCOUNTER — Other Ambulatory Visit (INDEPENDENT_AMBULATORY_CARE_PROVIDER_SITE_OTHER): Payer: Medicare HMO

## 2018-09-28 VITALS — BP 132/84 | HR 84 | Temp 97.6°F | Ht 72.0 in | Wt 278.1 lb

## 2018-09-28 DIAGNOSIS — M2550 Pain in unspecified joint: Secondary | ICD-10-CM

## 2018-09-28 DIAGNOSIS — E119 Type 2 diabetes mellitus without complications: Secondary | ICD-10-CM

## 2018-09-28 DIAGNOSIS — R200 Anesthesia of skin: Secondary | ICD-10-CM

## 2018-09-28 DIAGNOSIS — R202 Paresthesia of skin: Secondary | ICD-10-CM

## 2018-09-28 LAB — COMPREHENSIVE METABOLIC PANEL
ALT: 46 U/L (ref 0–53)
AST: 36 U/L (ref 0–37)
Albumin: 4.5 g/dL (ref 3.5–5.2)
Alkaline Phosphatase: 81 U/L (ref 39–117)
BUN: 20 mg/dL (ref 6–23)
CO2: 28 mEq/L (ref 19–32)
Calcium: 9.7 mg/dL (ref 8.4–10.5)
Chloride: 98 mEq/L (ref 96–112)
Creatinine, Ser: 1.11 mg/dL (ref 0.40–1.50)
GFR: 65.56 mL/min (ref >60.00)
Glucose, Bld: 205 mg/dL — ABNORMAL HIGH (ref 70–99)
Potassium: 4.7 mEq/L (ref 3.5–5.1)
Sodium: 137 mEq/L (ref 135–145)
Total Bilirubin: 1.5 mg/dL — ABNORMAL HIGH (ref 0.2–1.2)
Total Protein: 7.4 g/dL (ref 6.0–8.3)

## 2018-09-28 LAB — URIC ACID: Uric Acid, Serum: 10 mg/dL — ABNORMAL HIGH (ref 4.0–7.8)

## 2018-09-28 LAB — HEMOGLOBIN A1C: Hgb A1c MFr Bld: 7.3 % — ABNORMAL HIGH (ref 4.6–6.5)

## 2018-09-28 LAB — VITAMIN B12: Vitamin B-12: 1127 pg/mL — ABNORMAL HIGH (ref 211–911)

## 2018-09-28 LAB — MICROALBUMIN / CREATININE URINE RATIO
Creatinine,U: 426.7 mg/dL
Microalb Creat Ratio: 2.9 mg/g (ref 0.0–30.0)
Microalb, Ur: 12.4 mg/dL — ABNORMAL HIGH (ref 0.0–1.9)

## 2018-09-28 MED ORDER — CARVEDILOL 6.25 MG PO TABS: 6.2500 mg | ORAL_TABLET | Freq: Two times a day (BID) | ORAL | 3 refills | Status: DC

## 2018-09-28 MED ORDER — GLIMEPIRIDE 1 MG PO TABS: 1.0000 mg | ORAL_TABLET | Freq: Two times a day (BID) | ORAL | 3 refills | Status: DC

## 2018-09-28 MED ORDER — LEVOCETIRIZINE DIHYDROCHLORIDE 5 MG PO TABS: 5.0000 mg | ORAL_TABLET | Freq: Every evening | ORAL | 3 refills | Status: DC

## 2018-09-28 MED ORDER — HYDROCHLOROTHIAZIDE 25 MG PO TABS: 25.0000 mg | ORAL_TABLET | Freq: Every day | ORAL | 3 refills | Status: DC

## 2018-09-28 MED ORDER — GABAPENTIN 100 MG PO CAPS: 200.0000 mg | ORAL_CAPSULE | Freq: Every day | ORAL | 3 refills | Status: DC

## 2018-09-28 MED ORDER — ATORVASTATIN CALCIUM 20 MG PO TABS: 20.0000 mg | ORAL_TABLET | Freq: Every day | ORAL | 3 refills | Status: DC

## 2018-09-28 MED ORDER — METFORMIN HCL 1000 MG PO TABS: 1000.0000 mg | ORAL_TABLET | Freq: Two times a day (BID) | ORAL | 3 refills | Status: DC

## 2018-09-28 NOTE — Patient Instructions (Signed)
Diabetic Neuropathy Diabetic neuropathy refers to nerve damage that is caused by diabetes (diabetes mellitus). Over time, people with diabetes can develop nerve damage throughout the body. There are several types of diabetic neuropathy:  Peripheral neuropathy. This is the most common type of diabetic neuropathy. It causes damage to nerves that carry signals between the spinal cord and other parts of the body (peripheral nerves). This usually affects nerves in the feet and legs first, and may eventually affect the hands and arms. The damage affects the ability to sense touch or temperature.  Autonomic neuropathy. This type causes damage to nerves that control involuntary functions (autonomic nerves). These nerves carry signals that control: ? Heartbeat. ? Body temperature. ? Blood pressure. ? Urination. ? Digestion. ? Sweating. ? Sexual function. ? Response to changing blood sugar (glucose) levels.  Focal neuropathy. This type of nerve damage affects one area of the body, such as an arm, a leg, or the face. The injury may involve one nerve or a small group of nerves. Focal neuropathy can be painful and unpredictable, and occurs most often in older adults with diabetes. This often develops suddenly, but usually improves over time and does not cause long-term problems.  Proximal neuropathy. This type of nerve damage affects the nerves of the thighs, hips, buttocks, or legs. It causes severe pain, weakness, and muscle death (atrophy), usually in the thigh muscles. It is more common among older men and people who have type 2 diabetes. The length of recovery time may vary. What are the causes? Peripheral, autonomic, and focal neuropathies are caused by diabetes that is not well controlled with treatment. The cause of proximal neuropathy is not known, but it may be caused by inflammation related to uncontrolled blood glucose levels. What are the signs or symptoms? Peripheral neuropathy Peripheral  neuropathy develops slowly over time. When the nerves of the feet and legs no longer work, you may experience:  Burning, stabbing, or aching pain in the legs or feet.  Pain or cramping in the legs or feet.  Loss of feeling (numbness) and inability to feel pressure or pain in the feet. This can lead to: ? Thick calluses or sores on areas of constant pressure. ? Ulcers. ? Reduced ability to feel temperature changes.  Foot deformities.  Muscle weakness.  Loss of balance or coordination. Autonomic neuropathy The symptoms of autonomic neuropathy vary depending on which nerves are affected. Symptoms may include:  Problems with digestion, such as: ? Nausea or vomiting. ? Poor appetite. ? Bloating. ? Diarrhea or constipation. ? Trouble swallowing. ? Losing weight without trying to.  Problems with the heart, blood and lungs, such as: ? Dizziness, especially when standing up. ? Fainting. ? Shortness of breath. ? Irregular heartbeat.  Bladder problems, such as: ? Trouble starting or stopping urination. ? Leaking urine. ? Trouble emptying the bladder. ? Urinary tract infections (UTIs).  Problems with other body functions, such as: ? Sweat. You may sweat too much or too little. ? Temperature. You might get hot easily. Or, you might feel cold more than usual. ? Sexual function. Men may not be able to get or maintain an erection. Women may have vaginal dryness and difficulty with arousal. Focal neuropathy Symptoms affect only one area of the body. Common symptoms include:  Numbness.  Tingling.  Burning pain.  Prickling feeling.  Very sensitive skin.  Weakness.  Inability to move (paralysis).  Muscle twitching.  Muscles getting smaller (wasting).  Poor coordination.  Double or blurred vision. Proximal   neuropathy  Sudden, severe pain in the hip, thigh, or buttocks. Pain may spread from the back into the legs (sciatica).  Pain and numbness in the arms and  legs.  Tingling.  Loss of bladder control or bowel control.  Weakness and wasting of thigh muscles.  Difficulty getting up from a seated position.  Abdominal swelling.  Unexplained weight loss. How is this diagnosed? Diagnosis usually involves reviewing your medical history and any symptoms you have. Diagnosis varies depending on the type of neuropathy your health care provider suspects. Peripheral neuropathy Your health care provider will check areas that are affected by your nervous system (neurologic exam), such as your reflexes, how you move, and what you can feel. You may have other tests, such as:  Blood tests.  Removal and examination of fluid that surrounds the spinal cord (lumbar puncture).  CT scan.  MRI.  A test to check the nerves that control muscles (electromyogram, EMG).  Tests of how quickly messages pass through your nerves (nerve conduction velocity tests).  Removal of a small piece of nerve to be examined under a microscope (biopsy). Autonomic neuropathy You may have tests, such as:  Tests to measure your blood pressure and heart rate. This may include monitoring you while you are safely secured to an exam table that moves you from a lying position to an upright position (table tilt test).  Breathing tests to check your lungs.  Tests to check how food moves through the digestive system (gastric emptying tests).  Blood, sweat, or urine tests.  Ultrasound of your bladder.  Spinal fluid tests. Focal neuropathy This condition may be diagnosed with:  A neurologic exam.  CT scan.  MRI.  EMG.  Nerve conduction velocity tests. Proximal neuropathy There is no test to diagnose this type of neuropathy. You may have tests to rule out other possible causes of this type of neuropathy. Tests may include:  X-rays of your spine and lumbar region.  Lumbar puncture.  MRI. How is this treated? The goal of treatment is to keep nerve damage from getting  worse. The most important part of treatment is keeping your blood glucose level and your A1C level within your target range by following your diabetes management plan. Over time, maintaining lower blood glucose levels helps lessen symptoms. In some cases, you may need prescription pain medicine. Follow these instructions at home:  Lifestyle   Do not use any products that contain nicotine or tobacco, such as cigarettes and e-cigarettes. If you need help quitting, ask your health care provider.  Be physically active every day. Include strength training and balance exercises.  Follow a healthy meal plan.  Work with your health care provider to manage your blood pressure. General instructions  Follow your diabetes management plan as directed. ? Check your blood glucose levels as directed by your health care provider. ? Keep your blood glucose in your target range as directed by your health care provider. ? Have your A1C level checked at least two times a year, or as often as told by your health care provider.  Take over the counter and prescription medicines only as told by your health care provider. This includes insulin and diabetes medicine.  Do not drive or use heavy machinery while taking prescription pain medicines.  Check your skin and feet every day for cuts, bruises, redness, blisters, or sores.  Keep all follow up visits as told by your health care provider. This is important. Contact a health care provider if:  by your health care provider.  ? Keep your blood glucose in your target range as directed by your health care provider.  ? Have your A1C level checked at least two times a year, or as often as told by your health care provider.  · Take over the counter and prescription medicines only as told by your health care provider. This includes insulin and diabetes medicine.  · Do not drive or use heavy machinery while taking prescription pain medicines.  · Check your skin and feet every day for cuts, bruises, redness, blisters, or sores.  · Keep all follow up visits as told by your health care provider. This is important.  Contact a health care provider if:  · You have burning, stabbing, or aching pain in your legs or feet.  · You are unable to feel pressure or pain in your feet.  · You develop problems with digestion, such as:  ? Nausea.  ? Vomiting.  ? Bloating.  ? Constipation.  ? Diarrhea.  ? Abdominal pain.  · You have difficulty with urination, such as inability:  ? To control when you urinate (incontinence).  ? To completely empty the bladder (retention).  · You have palpitations.  · You feel dizzy, weak, or faint when you  stand up.  Get help right away if:  · You cannot urinate.  · You have sudden weakness or loss of coordination.  · You have trouble speaking.  · You have pain or pressure in your chest.  · You have an irregular heart beat.  · You have sudden inability to move a part of your body.  Summary  · Diabetic neuropathy refers to nerve damage that is caused by diabetes. It can affect nerves throughout the entire body, causing numbness and pain in the arms, legs, digestive tract, heart, and other body systems.  · Keep your blood glucose level and your blood pressure in your target range, as directed by your health care provider. This can help prevent neuropathy from getting worse.  · Check your skin and feet every day for cuts, bruises, redness, blisters, or sores.  · Do not use any products that contain nicotine or tobacco, such as cigarettes and e-cigarettes. If you need help quitting, ask your health care provider.  This information is not intended to replace advice given to you by your health care provider. Make sure you discuss any questions you have with your health care provider.  Document Released: 10/12/2001 Document Revised: 09/15/2017 Document Reviewed: 09/07/2016  Elsevier Interactive Patient Education © 2019 [employer]

## 2018-09-28 NOTE — Progress Notes (Signed)
Brian [name] is a 68 y.o. male with the following history as recorded in [employer]:  Patient Active Problem List   Diagnosis Date Noted  . DYSPNEA ON EXERTION 07/27/2008  . ELECTROCARDIOGRAM, ABNORMAL 07/27/2008  . DIABETES-TYPE 2 05/07/2008  . HYPERLIPIDEMIA 05/07/2008  . ERECTILE DYSFUNCTION 05/07/2008  . HYPERTENSION, BENIGN ESSENTIAL 05/07/2008    Current Outpatient Medications  Medication Sig Dispense Refill  . aspirin 81 MG chewable tablet Chew 81 mg by mouth daily.    Marland Kitchen atorvastatin (LIPITOR) 20 MG tablet Take 1 tablet (20 mg total) by mouth daily. 90 tablet 3  . azelastine (ASTELIN) 0.1 % nasal spray Place 2 sprays into both nostrils 2 (two) times daily. Use in each nostril as directed 30 mL 11  . carvedilol (COREG) 6.25 MG tablet Take 1 tablet (6.25 mg total) by mouth 2 (two) times daily with a meal. 180 tablet 3  . fluticasone (FLONASE) 50 MCG/ACT nasal spray Place 2 sprays into both nostrils daily. 16 g 11  . glimepiride (AMARYL) 1 MG tablet Take 1 tablet (1 mg total) by mouth 2 (two) times daily. 180 tablet 3  . hydrochlorothiazide (HYDRODIURIL) 25 MG tablet Take 1 tablet (25 mg total) by mouth daily. for high blood pressure 90 tablet 3  . levocetirizine (XYZAL) 5 MG tablet Take 1 tablet (5 mg total) by mouth every evening. 90 tablet 3  . metFORMIN (GLUCOPHAGE) 1000 MG tablet Take 1 tablet (1,000 mg total) by mouth 2 (two) times daily with a meal. 180 tablet 3  . Multiple Vitamin (MULTIVITAMIN WITH MINERALS) TABS tablet Take 1 tablet by mouth daily.    . Omega-3 Fatty Acids (FISH OIL) 1000 MG CAPS Take 1,000 mg by mouth daily.    . sildenafil (REVATIO) 20 MG tablet Take 20 mg by mouth daily as needed.    . vitamin B-12 (CYANOCOBALAMIN) 1000 MCG tablet Take 1,000 mcg by mouth daily.    Marland Kitchen gabapentin (NEURONTIN) 100 MG capsule Take 2 capsules (200 mg total) by mouth at bedtime. 180 capsule 3   No current facility-administered medications for this visit.     Allergies:  Patient has no known allergies.  Past Medical History:  Diagnosis Date  . Blood transfusion without reported diagnosis    with nasal sinus surgery  . Hyperlipidemia   . Hypertension     Past Surgical History:  Procedure Laterality Date  . ABDOMINAL SURGERY     scrapnel removed   . COLONOSCOPY  03/2005   Alphonsa Overall, polyp  . EYE SURGERY     x 7 removed metal from eyes - shrapnel  . HEMORROIDECTOMY    . left shoulder surgery    . NASAL SINUS SURGERY    . right shoulder surgery      Family History  Problem Relation Age of Onset  . Colon cancer Neg Hx   . Colon polyps Neg Hx   . Esophageal cancer Neg Hx   . Rectal cancer Neg Hx   . Stomach cancer Neg Hx   . Diabetes Sister   . Heart disease Brother     Social History   Tobacco Use  . Smoking status: Former Smoker    Packs/day: 5.00    Years: 4.00    Pack years: 20.00    Types: Cigarettes    Last attempt to quit: 08/17/1986    Years since quitting: 32.1  . Smokeless tobacco: Never Used  Substance Use Topics  . Alcohol use: Yes    Alcohol/week: 10.0  standard drinks    Types: 10 Glasses of wine per week    Subjective:  6 month follow-up on chronic care needs; history of Type 2 Diabetes/ hyperlipidemia;  Recently traveled to [place]- had concerns for swelling in ankles/ had to elevate, ice the ankles; no diagnosed history of gout; admits did eat increased amount of seafood/ drank more alcohol than normal; Also having increased numbness/ tingling in his legs recently; no sensation of needing to move his legs like restless leg; feels like pins and needles; diabetic x 20+ years; very well controlled;   Objective:  Vitals:   09/28/18 0913  BP: 132/84  Pulse: 84  Temp: 97.6 F (36.4 C)  TempSrc: Oral  SpO2: 98%  Weight: 278 lb 1.3 oz (126.1 kg)  Height: 6' (1.829 m)    General: Well developed, well nourished, in no acute distress  Skin : Warm and dry.  Head: Normocephalic and atraumatic  Eyes: Sclera and  conjunctiva clear; pupils round and reactive to light; extraocular movements intact  Ears: External normal; canals clear; tympanic membranes normal  Oropharynx: Pink, supple. No suspicious lesions  Neck: Supple without thyromegaly, adenopathy  Lungs: Respirations unlabored; clear to auscultation bilaterally without wheeze, rales, rhonchi  CVS exam: normal rate and regular rhythm.  Extremities: No edema, cyanosis, clubbing  Vessels: Symmetric bilaterally  Neurologic: Alert and oriented; speech intact; face symmetrical; moves all extremities well; CNII-XII intact without focal deficit   Assessment:  1. Type 2 diabetes mellitus without complication, without long-term current use of insulin ([place])   2. Numbness and tingling   3. Arthralgia, unspecified joint     Plan:  1. Stable; refills updated; check CBC, CMP, hgba1c today; plan to follow-up in 6 months; 2. Suspect neuropathy; trial of Gabapentin- start with 100 mg x 1 week and then increase to 200 mg nightly; patient to call back with response.  3. ? Gout; update uric acid today; can try tart cherry extract; follow up to be determined.  No follow-ups on file.  Orders Placed This Encounter  Procedures  . Comp Met (CMET)    Standing Status:   Future    Number of Occurrences:   1    Standing Expiration Date:   09/28/2019  . HgB A1c    Standing Status:   Future    Number of Occurrences:   1    Standing Expiration Date:   09/28/2019  . Uric acid    Standing Status:   Future    Number of Occurrences:   1    Standing Expiration Date:   09/28/2019  . B12    Standing Status:   Future    Number of Occurrences:   1    Standing Expiration Date:   09/28/2019  . Urine Microalbumin w/creat. ratio    Standing Status:   Future    Number of Occurrences:   1    Standing Expiration Date:   09/28/2019    Requested Prescriptions   Signed Prescriptions Disp Refills  . gabapentin (NEURONTIN) 100 MG capsule 180 capsule 3    Sig: Take 2 capsules (200  mg total) by mouth at bedtime.  . carvedilol (COREG) 6.25 MG tablet 180 tablet 3    Sig: Take 1 tablet (6.25 mg total) by mouth 2 (two) times daily with a meal.  . atorvastatin (LIPITOR) 20 MG tablet 90 tablet 3    Sig: Take 1 tablet (20 mg total) by mouth daily.  Marland Kitchen glimepiride (AMARYL) 1 MG tablet  180 tablet 3    Sig: Take 1 tablet (1 mg total) by mouth 2 (two) times daily.  . hydrochlorothiazide (HYDRODIURIL) 25 MG tablet 90 tablet 3    Sig: Take 1 tablet (25 mg total) by mouth daily. for high blood pressure  . levocetirizine (XYZAL) 5 MG tablet 90 tablet 3    Sig: Take 1 tablet (5 mg total) by mouth every evening.  . metFORMIN (GLUCOPHAGE) 1000 MG tablet 180 tablet 3    Sig: Take 1 tablet (1,000 mg total) by mouth 2 (two) times daily with a meal.

## 2018-09-30 ENCOUNTER — Other Ambulatory Visit: Payer: Self-pay | Admitting: Family

## 2018-09-30 MED ORDER — ALLOPURINOL 100 MG PO TABS: 100.0000 mg | ORAL_TABLET | Freq: Every day | ORAL | 0 refills | Status: DC

## 2018-09-30 MED ORDER — LISINOPRIL 2.5 MG PO TABS: 2.5000 mg | ORAL_TABLET | Freq: Every day | ORAL | 1 refills | Status: DC

## 2018-11-08 ENCOUNTER — Other Ambulatory Visit: Payer: Self-pay | Admitting: Family

## 2018-11-08 MED ORDER — HYDROCHLOROTHIAZIDE 25 MG PO TABS: 25.0000 mg | ORAL_TABLET | Freq: Every day | ORAL | 3 refills | Status: DC

## 2018-12-19 ENCOUNTER — Other Ambulatory Visit: Payer: Self-pay | Admitting: Family

## 2019-02-28 DIAGNOSIS — R69 Illness, unspecified: Secondary | ICD-10-CM | POA: Diagnosis not present

## 2019-03-01 DIAGNOSIS — R69 Illness, unspecified: Secondary | ICD-10-CM | POA: Diagnosis not present

## 2019-03-13 ENCOUNTER — Other Ambulatory Visit: Payer: Self-pay | Admitting: Family

## 2019-04-26 DIAGNOSIS — R69 Illness, unspecified: Secondary | ICD-10-CM | POA: Diagnosis not present

## 2019-06-12 ENCOUNTER — Other Ambulatory Visit: Payer: Self-pay | Admitting: Family

## 2019-06-12 ENCOUNTER — Encounter: Payer: Self-pay | Admitting: Internal Medicine

## 2019-06-12 ENCOUNTER — Ambulatory Visit (INDEPENDENT_AMBULATORY_CARE_PROVIDER_SITE_OTHER): Payer: Medicare HMO | Admitting: Internal Medicine

## 2019-06-12 DIAGNOSIS — S99922A Unspecified injury of left foot, initial encounter: Secondary | ICD-10-CM | POA: Diagnosis not present

## 2019-06-12 MED ORDER — GABAPENTIN 100 MG PO CAPS: 300.0000 mg | ORAL_CAPSULE | Freq: Every day | ORAL | 3 refills | Status: DC

## 2019-06-12 NOTE — Assessment & Plan Note (Signed)
He hit his left middle toe a few days ago and bruised it No obvious infection.  His toe actually looks better today after being off of it for a couple of days No need for additional treatment He will monitor this closely.  If he sees any concerning changes he will call.  Reviewed things to monitor for

## 2019-06-12 NOTE — Progress Notes (Signed)
Virtual Visit via Video Note  I connected with Brian [name] on 06/12/19 at  9:15 AM EDT by a video enabled telemedicine application and verified that I am speaking with the correct person using two identifiers.   I discussed the limitations of evaluation and management by telemedicine and the availability of in person appointments. The patient expressed understanding and agreed to proceed.  The patient is currently at home and I am in the office.    No referring provider.    History of Present Illness: This is an acute visit for toe injury.  A few days ago he was riding his lawnmower with flip-flops and he banged his left middle toe when he was getting off the mower.  He has neuropathy of both feet and has chronic numbness.  He has been taking gabapentin and that has helped.  He is wondering if that could be increased.  He injured the left third toe.  He has been applying peroxide and Neosporin.  He states it is discolored.  It was dark blue underneath the toenail and then the area was slightly swollen and some redness.  He did have a little bit of dried blood when he first noticed the toe, but there has been no additional bleeding.  He denies any discharge or pus.  The toe is not warm to touch.  He does not have much pain, but that could be related to the neuropathy.  Last week he [place] and the toe appeared to be getting worse, but the last 2 days he did have them off any elevated the foot a lot and looks better today.  The discoloration is not moving down the toe to his foot.  He denies any fevers or chills.  He is a diabetic and was concerned about the injury and possible infection.   Social History   Socioeconomic History  . Marital status: Married    Spouse name: Not on file  . Number of children: Not on file  . Years of education: Not on file  . Highest education level: Not on file  Occupational History  . Not on file  Social Needs   . Financial resource strain: Not on file  . Food insecurity    Worry: Not on file    Inability: Not on file  . Transportation needs    Medical: Not on file    Non-medical: Not on file  Tobacco Use  . Smoking status: Former Smoker    Packs/day: 5.00    Years: 4.00    Pack years: 20.00    Types: Cigarettes    Quit date: 08/17/1986    Years since quitting: 32.8  . Smokeless tobacco: Never Used  Substance and Sexual Activity  . Alcohol use: Yes    Alcohol/week: 10.0 standard drinks    Types: 10 Glasses of wine per week  . Drug use: No  . Sexual activity: Not on file  Lifestyle  . Physical activity    Days per week: Not on file    Minutes per session: Not on file  . Stress: Not on file  Relationships  . Social Herbalist on phone: Not on file    Gets together: Not on file    Attends religious service: Not on file    Active member of club or organization: Not on file    Attends meetings of clubs or organizations: Not on file  Relationship status: Not on file  Other Topics Concern  . Not on file  Social History Narrative  . Not on file     Observations/Objective: Appears well in NAD Left third toe with discoloration underneath the toenail and around the nailbed.  Discoloration appears to be bruising.  No obvious open wound, discharge or bleeding.  No obvious swelling.  The discoloration does not extend down the toe to the foot.  He is able to move all of his toes without difficulty.  There is no warmth per his wife.  Assessment and Plan:  Advised that he can increase the Neurontin to 300 mg daily at night for the neuropathy to see if that helps    See Problem List for Assessment and Plan of chronic medical problems.   Follow Up Instructions:  He is overdue for a follow-up with his PCP-we will have someone from the office call him to set up a routine follow-up appointment  I discussed the assessment and treatment plan with the patient. The patient was  provided an opportunity to ask questions and all were answered. The patient agreed with the plan and demonstrated an understanding of the instructions.   The patient was advised to call back or seek an in-person evaluation if the symptoms worsen or if the condition fails to improve as anticipated.    Binnie Rail, MD

## 2019-06-30 ENCOUNTER — Encounter: Payer: Self-pay | Admitting: Family

## 2019-06-30 ENCOUNTER — Ambulatory Visit (INDEPENDENT_AMBULATORY_CARE_PROVIDER_SITE_OTHER): Payer: Medicare HMO | Admitting: Family

## 2019-06-30 ENCOUNTER — Other Ambulatory Visit (INDEPENDENT_AMBULATORY_CARE_PROVIDER_SITE_OTHER): Payer: Medicare HMO

## 2019-06-30 ENCOUNTER — Other Ambulatory Visit: Payer: Self-pay

## 2019-06-30 VITALS — BP 120/70 | HR 71 | Temp 97.7°F | Ht 72.0 in | Wt 273.2 lb

## 2019-06-30 DIAGNOSIS — E119 Type 2 diabetes mellitus without complications: Secondary | ICD-10-CM

## 2019-06-30 DIAGNOSIS — E782 Mixed hyperlipidemia: Secondary | ICD-10-CM

## 2019-06-30 DIAGNOSIS — E790 Hyperuricemia without signs of inflammatory arthritis and tophaceous disease: Secondary | ICD-10-CM

## 2019-06-30 DIAGNOSIS — I10 Essential (primary) hypertension: Secondary | ICD-10-CM

## 2019-06-30 LAB — COMPREHENSIVE METABOLIC PANEL
ALT: 42 U/L (ref 0–53)
AST: 36 U/L (ref 0–37)
Albumin: 4.4 g/dL (ref 3.5–5.2)
Alkaline Phosphatase: 78 U/L (ref 39–117)
BUN: 23 mg/dL (ref 6–23)
CO2: 29 mEq/L (ref 19–32)
Calcium: 9.4 mg/dL (ref 8.4–10.5)
Chloride: 98 mEq/L (ref 96–112)
Creatinine, Ser: 1.06 mg/dL (ref 0.40–1.50)
GFR: 68.99 mL/min (ref >60.00)
Glucose, Bld: 176 mg/dL — ABNORMAL HIGH (ref 70–99)
Potassium: 4.3 mEq/L (ref 3.5–5.1)
Sodium: 135 mEq/L (ref 135–145)
Total Bilirubin: 1.5 mg/dL — ABNORMAL HIGH (ref 0.2–1.2)
Total Protein: 7.1 g/dL (ref 6.0–8.3)

## 2019-06-30 LAB — CBC WITH DIFFERENTIAL/PLATELET
Basophils Absolute: 0 10*3/uL (ref 0.0–0.1)
Basophils Relative: 0.6 % (ref 0.0–3.0)
Eosinophils Absolute: 0.3 10*3/uL (ref 0.0–0.7)
Eosinophils Relative: 7.1 % — ABNORMAL HIGH (ref 0.0–5.0)
HCT: 42 % (ref 39.0–52.0)
Hemoglobin: 14.8 g/dL (ref 13.0–17.0)
Lymphocytes Relative: 18.3 % (ref 12.0–46.0)
Lymphs Abs: 0.9 10*3/uL (ref 0.7–4.0)
MCHC: 35.1 g/dL (ref 30.0–36.0)
MCV: 94.4 fl (ref 78.0–100.0)
Monocytes Absolute: 0.6 10*3/uL (ref 0.1–1.0)
Monocytes Relative: 12.4 % — ABNORMAL HIGH (ref 3.0–12.0)
Neutro Abs: 2.9 10*3/uL (ref 1.4–7.7)
Neutrophils Relative %: 61.6 % (ref 43.0–77.0)
Platelets: 238 10*3/uL (ref 150.0–400.0)
RBC: 4.45 Mil/uL (ref 4.22–5.81)
RDW: 12.2 % (ref 11.5–15.5)
WBC: 4.7 10*3/uL (ref 4.0–10.5)

## 2019-06-30 LAB — LIPID PANEL
Cholesterol: 116 mg/dL (ref 0–200)
HDL: 23 mg/dL — ABNORMAL LOW (ref >39.00)
NonHDL: 92.69
Total CHOL/HDL Ratio: 5
Triglycerides: 240 mg/dL — ABNORMAL HIGH (ref 0.0–149.0)
VLDL: 48 mg/dL — ABNORMAL HIGH (ref 0.0–40.0)

## 2019-06-30 LAB — URIC ACID: Uric Acid, Serum: 7.2 mg/dL (ref 4.0–7.8)

## 2019-06-30 LAB — HEMOGLOBIN A1C: Hgb A1c MFr Bld: 7.3 % — ABNORMAL HIGH (ref 4.6–6.5)

## 2019-06-30 LAB — LDL CHOLESTEROL, DIRECT: Direct LDL: 57 mg/dL

## 2019-06-30 MED ORDER — CEPHALEXIN 500 MG PO CAPS: 500.0000 mg | ORAL_CAPSULE | Freq: Three times a day (TID) | ORAL | 0 refills | Status: DC

## 2019-06-30 MED ORDER — GABAPENTIN 100 MG PO CAPS: ORAL_CAPSULE | ORAL | 3 refills | Status: DC

## 2019-06-30 NOTE — Progress Notes (Signed)
Brian [name] is a 68 y.o. male with the following history as recorded in [facility]:  Patient Active Problem List   Diagnosis Date Noted  . Toe injury, left, initial encounter 06/12/2019  . DYSPNEA ON EXERTION 07/27/2008  . ELECTROCARDIOGRAM, ABNORMAL 07/27/2008  . DIABETES-TYPE 2 05/07/2008  . Hyperlipidemia 05/07/2008  . ERECTILE DYSFUNCTION 05/07/2008  . HYPERTENSION, BENIGN ESSENTIAL 05/07/2008    Current Outpatient Medications  Medication Sig Dispense Refill  . allopurinol (ZYLOPRIM) 100 MG tablet Take 1 tablet by mouth once daily 90 tablet 0  . aspirin 81 MG chewable tablet Chew 81 mg by mouth daily.    Marland Kitchen atorvastatin (LIPITOR) 20 MG tablet Take 1 tablet (20 mg total) by mouth daily. 90 tablet 3  . azelastine (ASTELIN) 0.1 % nasal spray Place 2 sprays into both nostrils 2 (two) times daily. Use in each nostril as directed 30 mL 11  . carvedilol (COREG) 6.25 MG tablet Take 1 tablet (6.25 mg total) by mouth 2 (two) times daily with a meal. 180 tablet 3  . fluticasone (FLONASE) 50 MCG/ACT nasal spray Place 2 sprays into both nostrils daily. 16 g 11  . gabapentin (NEURONTIN) 100 MG capsule 1 tablet in the am and 2 in the pm; 270 capsule 3  . glimepiride (AMARYL) 1 MG tablet Take 1 tablet (1 mg total) by mouth 2 (two) times daily. 180 tablet 3  . hydrochlorothiazide (HYDRODIURIL) 25 MG tablet Take 1 tablet (25 mg total) by mouth daily. for high blood pressure 90 tablet 3  . levocetirizine (XYZAL) 5 MG tablet Take 1 tablet (5 mg total) by mouth every evening. 90 tablet 3  . lisinopril (ZESTRIL) 2.5 MG tablet Take 1 tablet by mouth once daily 90 tablet 0  . metFORMIN (GLUCOPHAGE) 1000 MG tablet Take 1 tablet (1,000 mg total) by mouth 2 (two) times daily with a meal. 180 tablet 3  . Multiple Vitamin (MULTIVITAMIN WITH MINERALS) TABS tablet Take 1 tablet by mouth daily.    . Omega-3 Fatty Acids (FISH OIL) 1000 MG CAPS Take 1,000 mg by mouth daily.    . sildenafil (REVATIO) 20 MG  tablet Take 20 mg by mouth daily as needed.    . vitamin B-12 (CYANOCOBALAMIN) 1000 MCG tablet Take 1,000 mcg by mouth daily.    . cephALEXin (KEFLEX) 500 MG capsule Take 1 capsule (500 mg total) by mouth 3 (three) times daily. 15 capsule 0   No current facility-administered medications for this visit.     Allergies: Patient has no known allergies.  Past Medical History:  Diagnosis Date  . Blood transfusion without reported diagnosis    with nasal sinus surgery  . Hyperlipidemia   . Hypertension     Past Surgical History:  Procedure Laterality Date  . ABDOMINAL SURGERY     scrapnel removed   . COLONOSCOPY  03/2005   Alphonsa Overall, polyp  . EYE SURGERY     x 7 removed metal from eyes - shrapnel  . HEMORROIDECTOMY    . left shoulder surgery    . NASAL SINUS SURGERY    . right shoulder surgery      Family History  Problem Relation Age of Onset  . Colon cancer Neg Hx   . Colon polyps Neg Hx   . Esophageal cancer Neg Hx   . Rectal cancer Neg Hx   . Stomach cancer Neg Hx   . Diabetes Sister   . Heart disease Brother     Social History  Tobacco Use  . Smoking status: Former Smoker    Packs/day: 5.00    Years: 4.00    Pack years: 20.00    Types: Cigarettes    Quit date: 08/17/1986    Years since quitting: 32.8  . Smokeless tobacco: Never Used  Substance Use Topics  . Alcohol use: Yes    Alcohol/week: 10.0 standard drinks    Types: 10 Glasses of wine per week    Subjective:  Presents to follow-up on chronic care needs including:  1) Type 2 Diabetes; 2) Neuropathy- good response to Gabapentin; now taking 1 in am and 2 in pm; 3) Hyperlipiemia- stable on medication; 4) Hypertension- tolerating well; Denies any chest pain, shortness of breath, blurred vision or headache     Objective:  Vitals:   06/30/19 0900  BP: 120/70  Pulse: 71  Temp: 97.7 F (36.5 C)  TempSrc: Oral  SpO2: 96%  Weight: 273 lb 3.2 oz (123.9 kg)  Height: 6' (1.829 m)    General: Well  developed, well nourished, in no acute distress  Skin : Warm and dry.  Head: Normocephalic and atraumatic  Eyes: Sclera and conjunctiva clear; pupils round and reactive to light; extraocular movements intact  Ears: External normal; canals clear; tympanic membranes normal  Oropharynx: Pink, supple. No suspicious lesions  Neck: Supple without thyromegaly, adenopathy  Lungs: Respirations unlabored; clear to auscultation bilaterally without wheeze, rales, rhonchi  CVS exam: normal rate and regular rhythm.  Abdomen: Soft; nontender; nondistended; normoactive bowel sounds; no masses or hepatosplenomegaly  Musculoskeletal: No deformities; no active joint inflammation  Extremities: No edema, cyanosis, clubbing  Vessels: Symmetric bilaterally  Neurologic: Alert and oriented; speech intact; face symmetrical; moves all extremities well; CNII-XII intact without focal deficit  Assessment:  1. Type 2 diabetes mellitus without complication, without long-term current use of insulin (HCC)   2. Elevated uric acid in blood   3. HYPERTENSION, BENIGN ESSENTIAL   4. Mixed hyperlipidemia     Plan:  Update labs and refills; has eye exam scheduled for December; foot exam normal except for bruising noted on toenail; Follow up in 6 months, sooner prn.   No follow-ups on file.  Orders Placed This Encounter  Procedures  . CBC w/Diff    Standing Status:   Future    Standing Expiration Date:   06/29/2020  . Comp Met (CMET)    Standing Status:   Future    Standing Expiration Date:   06/29/2020  . Lipid panel    Standing Status:   Future    Standing Expiration Date:   06/29/2020  . HgB A1c    Standing Status:   Future    Standing Expiration Date:   06/29/2020  . Uric acid    Standing Status:   Future    Standing Expiration Date:   06/29/2020    Requested Prescriptions   Signed Prescriptions Disp Refills  . gabapentin (NEURONTIN) 100 MG capsule 270 capsule 3    Sig: 1 tablet in the am and 2 in the pm;   . cephALEXin (KEFLEX) 500 MG capsule 15 capsule 0    Sig: Take 1 capsule (500 mg total) by mouth 3 (three) times daily.

## 2019-08-29 DIAGNOSIS — H5203 Hypermetropia, bilateral: Secondary | ICD-10-CM | POA: Diagnosis not present

## 2019-08-29 DIAGNOSIS — H04123 Dry eye syndrome of bilateral lacrimal glands: Secondary | ICD-10-CM | POA: Diagnosis not present

## 2019-08-29 DIAGNOSIS — H2513 Age-related nuclear cataract, bilateral: Secondary | ICD-10-CM | POA: Diagnosis not present

## 2019-09-09 ENCOUNTER — Other Ambulatory Visit: Payer: Self-pay | Admitting: Family

## 2019-09-09 ENCOUNTER — Other Ambulatory Visit: Payer: Self-pay | Admitting: Internal Medicine

## 2019-09-09 NOTE — Telephone Encounter (Signed)
Please refill as per office routine med refill policy (all routine meds refilled for 3 mo or monthly per pt preference up to one year from last visit, then month to month grace period for 3 mo, then further med refills will have to be denied)  

## 2019-09-13 DIAGNOSIS — R69 Illness, unspecified: Secondary | ICD-10-CM | POA: Diagnosis not present

## 2019-09-14 ENCOUNTER — Other Ambulatory Visit: Payer: Self-pay | Admitting: Family

## 2019-09-23 ENCOUNTER — Other Ambulatory Visit: Payer: Self-pay | Admitting: Family

## 2019-09-26 DIAGNOSIS — D239 Other benign neoplasm of skin, unspecified: Secondary | ICD-10-CM | POA: Diagnosis not present

## 2019-09-26 DIAGNOSIS — L821 Other seborrheic keratosis: Secondary | ICD-10-CM | POA: Diagnosis not present

## 2019-09-26 DIAGNOSIS — Z23 Encounter for immunization: Secondary | ICD-10-CM | POA: Diagnosis not present

## 2019-09-26 DIAGNOSIS — L918 Other hypertrophic disorders of the skin: Secondary | ICD-10-CM | POA: Diagnosis not present

## 2019-09-26 DIAGNOSIS — L570 Actinic keratosis: Secondary | ICD-10-CM | POA: Diagnosis not present

## 2019-09-26 DIAGNOSIS — L820 Inflamed seborrheic keratosis: Secondary | ICD-10-CM | POA: Diagnosis not present

## 2019-10-05 ENCOUNTER — Other Ambulatory Visit: Payer: Self-pay | Admitting: Family

## 2019-10-23 ENCOUNTER — Other Ambulatory Visit: Payer: Self-pay | Admitting: Family

## 2019-11-15 ENCOUNTER — Other Ambulatory Visit: Payer: Self-pay | Admitting: Family

## 2019-12-11 ENCOUNTER — Other Ambulatory Visit: Payer: Self-pay | Admitting: Internal Medicine

## 2019-12-11 ENCOUNTER — Other Ambulatory Visit: Payer: Self-pay | Admitting: Family

## 2019-12-22 ENCOUNTER — Other Ambulatory Visit: Payer: Self-pay | Admitting: Family

## 2020-01-08 ENCOUNTER — Other Ambulatory Visit: Payer: Self-pay | Admitting: Family

## 2020-01-22 ENCOUNTER — Other Ambulatory Visit: Payer: Self-pay | Admitting: Family

## 2020-02-05 ENCOUNTER — Other Ambulatory Visit: Payer: Self-pay | Admitting: Family

## 2020-02-05 DIAGNOSIS — L821 Other seborrheic keratosis: Secondary | ICD-10-CM | POA: Diagnosis not present

## 2020-02-05 DIAGNOSIS — L820 Inflamed seborrheic keratosis: Secondary | ICD-10-CM | POA: Diagnosis not present

## 2020-02-05 DIAGNOSIS — D485 Neoplasm of uncertain behavior of skin: Secondary | ICD-10-CM | POA: Diagnosis not present

## 2020-02-05 DIAGNOSIS — D2371 Other benign neoplasm of skin of right lower limb, including hip: Secondary | ICD-10-CM | POA: Diagnosis not present

## 2020-02-05 DIAGNOSIS — D225 Melanocytic nevi of trunk: Secondary | ICD-10-CM | POA: Diagnosis not present

## 2020-02-05 DIAGNOSIS — L814 Other melanin hyperpigmentation: Secondary | ICD-10-CM | POA: Diagnosis not present

## 2020-02-05 DIAGNOSIS — D2372 Other benign neoplasm of skin of left lower limb, including hip: Secondary | ICD-10-CM | POA: Diagnosis not present

## 2020-02-05 DIAGNOSIS — L578 Other skin changes due to chronic exposure to nonionizing radiation: Secondary | ICD-10-CM | POA: Diagnosis not present

## 2020-02-05 DIAGNOSIS — L570 Actinic keratosis: Secondary | ICD-10-CM | POA: Diagnosis not present

## 2020-02-10 ENCOUNTER — Other Ambulatory Visit: Payer: Self-pay | Admitting: Family

## 2020-03-04 ENCOUNTER — Other Ambulatory Visit: Payer: Self-pay | Admitting: Family

## 2020-03-09 ENCOUNTER — Other Ambulatory Visit: Payer: Self-pay | Admitting: Family

## 2020-03-16 ENCOUNTER — Other Ambulatory Visit: Payer: Self-pay | Admitting: Family

## 2020-03-26 DIAGNOSIS — R69 Illness, unspecified: Secondary | ICD-10-CM | POA: Diagnosis not present

## 2020-04-09 ENCOUNTER — Other Ambulatory Visit: Payer: Self-pay | Admitting: Family

## 2020-04-17 ENCOUNTER — Ambulatory Visit: Payer: Medicare HMO | Admitting: Family

## 2020-04-18 ENCOUNTER — Other Ambulatory Visit: Payer: Self-pay | Admitting: Family

## 2020-04-20 ENCOUNTER — Other Ambulatory Visit: Payer: Self-pay | Admitting: Family

## 2020-05-12 ENCOUNTER — Other Ambulatory Visit: Payer: Self-pay | Admitting: Family

## 2020-05-15 DIAGNOSIS — R69 Illness, unspecified: Secondary | ICD-10-CM | POA: Diagnosis not present

## 2020-06-08 ENCOUNTER — Other Ambulatory Visit: Payer: Self-pay | Admitting: Family

## 2020-06-13 ENCOUNTER — Other Ambulatory Visit: Payer: Self-pay | Admitting: Family

## 2020-06-18 ENCOUNTER — Other Ambulatory Visit: Payer: Self-pay | Admitting: Family

## 2020-06-24 ENCOUNTER — Telehealth: Payer: Self-pay | Admitting: Family

## 2020-06-24 NOTE — Telephone Encounter (Signed)
LVM for pt to rtn my call to schedule AWV-I with [facility]. Please schedule this appt if patient calls the office.    Brian [name] [phone]

## 2020-07-01 ENCOUNTER — Ambulatory Visit (INDEPENDENT_AMBULATORY_CARE_PROVIDER_SITE_OTHER): Payer: Medicare HMO | Admitting: Family

## 2020-07-01 ENCOUNTER — Other Ambulatory Visit: Payer: Self-pay

## 2020-07-01 ENCOUNTER — Encounter: Payer: Self-pay | Admitting: Family

## 2020-07-01 VITALS — BP 130/78 | HR 78 | Temp 97.8°F | Ht 72.0 in | Wt 279.6 lb

## 2020-07-01 DIAGNOSIS — E782 Mixed hyperlipidemia: Secondary | ICD-10-CM

## 2020-07-01 DIAGNOSIS — Z125 Encounter for screening for malignant neoplasm of prostate: Secondary | ICD-10-CM

## 2020-07-01 DIAGNOSIS — Z0000 Encounter for general adult medical examination without abnormal findings: Secondary | ICD-10-CM

## 2020-07-01 DIAGNOSIS — M1A9XX0 Chronic gout, unspecified, without tophus (tophi): Secondary | ICD-10-CM

## 2020-07-01 DIAGNOSIS — I10 Essential (primary) hypertension: Secondary | ICD-10-CM

## 2020-07-01 DIAGNOSIS — H6983 Other specified disorders of Eustachian tube, bilateral: Secondary | ICD-10-CM

## 2020-07-01 DIAGNOSIS — E119 Type 2 diabetes mellitus without complications: Secondary | ICD-10-CM

## 2020-07-01 LAB — LDL CHOLESTEROL, DIRECT: Direct LDL: 59 mg/dL

## 2020-07-01 LAB — LIPID PANEL
Cholesterol: 116 mg/dL (ref 0–200)
HDL: 24 mg/dL — ABNORMAL LOW (ref >39.00)
NonHDL: 92.1
Total CHOL/HDL Ratio: 5
Triglycerides: 287 mg/dL — ABNORMAL HIGH (ref 0.0–149.0)
VLDL: 57.4 mg/dL — ABNORMAL HIGH (ref 0.0–40.0)

## 2020-07-01 LAB — COMPREHENSIVE METABOLIC PANEL
ALT: 36 U/L (ref 0–53)
AST: 29 U/L (ref 0–37)
Albumin: 4.3 g/dL (ref 3.5–5.2)
Alkaline Phosphatase: 76 U/L (ref 39–117)
BUN: 23 mg/dL (ref 6–23)
CO2: 26 mEq/L (ref 19–32)
Calcium: 9 mg/dL (ref 8.4–10.5)
Chloride: 97 mEq/L (ref 96–112)
Creatinine, Ser: 0.96 mg/dL (ref 0.40–1.50)
GFR: 79.58 mL/min (ref >60.00)
Glucose, Bld: 225 mg/dL — ABNORMAL HIGH (ref 70–99)
Potassium: 4.5 mEq/L (ref 3.5–5.1)
Sodium: 131 mEq/L — ABNORMAL LOW (ref 135–145)
Total Bilirubin: 1.5 mg/dL — ABNORMAL HIGH (ref 0.2–1.2)
Total Protein: 6.8 g/dL (ref 6.0–8.3)

## 2020-07-01 LAB — CBC WITH DIFFERENTIAL/PLATELET
Basophils Absolute: 0 10*3/uL (ref 0.0–0.1)
Basophils Relative: 0.7 % (ref 0.0–3.0)
Eosinophils Absolute: 0.4 10*3/uL (ref 0.0–0.7)
Eosinophils Relative: 6.6 % — ABNORMAL HIGH (ref 0.0–5.0)
HCT: 42.3 % (ref 39.0–52.0)
Hemoglobin: 15.1 g/dL (ref 13.0–17.0)
Lymphocytes Relative: 17.8 % (ref 12.0–46.0)
Lymphs Abs: 1.1 10*3/uL (ref 0.7–4.0)
MCHC: 35.7 g/dL (ref 30.0–36.0)
MCV: 93.2 fl (ref 78.0–100.0)
Monocytes Absolute: 0.7 10*3/uL (ref 0.1–1.0)
Monocytes Relative: 11.6 % (ref 3.0–12.0)
Neutro Abs: 3.8 10*3/uL (ref 1.4–7.7)
Neutrophils Relative %: 63.3 % (ref 43.0–77.0)
Platelets: 226 10*3/uL (ref 150.0–400.0)
RBC: 4.54 Mil/uL (ref 4.22–5.81)
RDW: 12.6 % (ref 11.5–15.5)
WBC: 6 10*3/uL (ref 4.0–10.5)

## 2020-07-01 LAB — HEMOGLOBIN A1C: Hgb A1c MFr Bld: 8 % — ABNORMAL HIGH (ref 4.6–6.5)

## 2020-07-01 LAB — PSA: PSA: 1.17 ng/mL (ref 0.10–4.00)

## 2020-07-01 LAB — URIC ACID: Uric Acid, Serum: 7.1 mg/dL (ref 4.0–7.8)

## 2020-07-01 MED ORDER — PREDNISONE 20 MG PO TABS: 20.0000 mg | ORAL_TABLET | Freq: Every day | ORAL | 0 refills | Status: DC

## 2020-07-01 MED ORDER — AMOXICILLIN-POT CLAVULANATE 875-125 MG PO TABS: 1.0000 | ORAL_TABLET | Freq: Two times a day (BID) | ORAL | 0 refills | Status: AC

## 2020-07-01 NOTE — Progress Notes (Signed)
Brian [name] is a 69 y.o. male with the following history as recorded in [facility]:  Patient Active Problem List   Diagnosis Date Noted  . Toe injury, left, initial encounter 06/12/2019  . DYSPNEA ON EXERTION 07/27/2008  . ELECTROCARDIOGRAM, ABNORMAL 07/27/2008  . DIABETES-TYPE 2 05/07/2008  . Hyperlipidemia 05/07/2008  . ERECTILE DYSFUNCTION 05/07/2008  . HYPERTENSION, BENIGN ESSENTIAL 05/07/2008    Current Outpatient Medications  Medication Sig Dispense Refill  . allopurinol (ZYLOPRIM) 100 MG tablet Take 1 tablet by mouth once daily 90 tablet 0  . aspirin 81 MG chewable tablet Chew 81 mg by mouth daily.    Marland Kitchen atorvastatin (LIPITOR) 20 MG tablet Take 1 tablet by mouth once daily 90 tablet 0  . Azelastine HCl 137 MCG/SPRAY SOLN USE 2 SPRAY(S) IN EACH NOSTRIL TWICE DAILY AS DIRECTED 30 mL 0  . carvedilol (COREG) 6.25 MG tablet TAKE 1 TABLET BY MOUTH TWICE DAILY WITH MEALS 180 tablet 0  . fluticasone (FLONASE) 50 MCG/ACT nasal spray Use 2 spray(s) in each nostril once daily 16 g 0  . gabapentin (NEURONTIN) 100 MG capsule 1 tablet in the am and 2 in the pm; 270 capsule 3  . glimepiride (AMARYL) 1 MG tablet Take 1 tablet by mouth twice daily 180 tablet 0  . hydrochlorothiazide (HYDRODIURIL) 25 MG tablet TAKE 1 TABLET BY MOUTH ONCE DAILY FOR HIGH BLOOD PRESSURE 90 tablet 0  . levocetirizine (XYZAL) 5 MG tablet TAKE 1 TABLET BY MOUTH ONCE DAILY IN THE EVENING 90 tablet 0  . lisinopril (ZESTRIL) 2.5 MG tablet Take 1 tablet by mouth once daily 90 tablet 0  . metFORMIN (GLUCOPHAGE) 1000 MG tablet TAKE 1 TABLET BY MOUTH TWICE DAILY WITH MEALS 180 tablet 0  . Multiple Vitamin (MULTIVITAMIN WITH MINERALS) TABS tablet Take 1 tablet by mouth daily.    . Omega-3 Fatty Acids (FISH OIL) 1000 MG CAPS Take 1,000 mg by mouth daily.    . sildenafil (REVATIO) 20 MG tablet Take 20 mg by mouth daily as needed.    . vitamin B-12 (CYANOCOBALAMIN) 1000 MCG tablet Take 1,000 mcg by mouth daily.    Marland Kitchen  amoxicillin-clavulanate (AUGMENTIN) 875-125 MG tablet Take 1 tablet by mouth 2 (two) times daily for 10 days. 20 tablet 0  . predniSONE (DELTASONE) 20 MG tablet Take 1 tablet (20 mg total) by mouth daily with breakfast. 5 tablet 0   No current facility-administered medications for this visit.    Allergies: Patient has no known allergies.  Past Medical History:  Diagnosis Date  . Blood transfusion without reported diagnosis    with nasal sinus surgery  . Hyperlipidemia   . Hypertension     Past Surgical History:  Procedure Laterality Date  . ABDOMINAL SURGERY     scrapnel removed   . COLONOSCOPY  03/2005   Alphonsa Overall, polyp  . EYE SURGERY     x 7 removed metal from eyes - shrapnel  . HEMORROIDECTOMY    . left shoulder surgery    . NASAL SINUS SURGERY    . right shoulder surgery      Family History  Problem Relation Age of Onset  . Colon cancer Neg Hx   . Colon polyps Neg Hx   . Esophageal cancer Neg Hx   . Rectal cancer Neg Hx   . Stomach cancer Neg Hx   . Diabetes Sister   . Heart disease Brother     Social History   Tobacco Use  . Smoking status: Former  Smoker    Packs/day: 5.00    Years: 4.00    Pack years: 20.00    Types: Cigarettes    Quit date: 08/17/1986    Years since quitting: 33.8  . Smokeless tobacco: Never Used  Substance Use Topics  . Alcohol use: Yes    Alcohol/week: 10.0 standard drinks    Types: 10 Glasses of wine per week    Subjective:  Yearly CPE/ follow-up on chronic care needs; will get yearly labs done; patient is concerned that hearing is not as good recently- wondering if has ear wax problem;  Up to date on dental and vision exams; has had flu and COVID vaccines;   Review of Systems  Constitutional: Negative.   HENT: Positive for congestion and hearing loss.   Eyes: Negative.   Respiratory: Negative.   Cardiovascular: Negative.   Gastrointestinal: Negative.   Genitourinary: Negative.   Musculoskeletal: Negative.   Skin: Negative.    Neurological: Negative.   Endo/Heme/Allergies: Negative.   Psychiatric/Behavioral: Negative.      Objective:  Vitals:   07/01/20 0842  BP: 130/78  Pulse: 78  Temp: 97.8 F (36.6 C)  TempSrc: Oral  SpO2: 95%  Weight: 279 lb 9.6 oz (126.8 kg)  Height: 6' (1.829 m)    General: Well developed, well nourished, in no acute distress  Skin : Warm and dry.  Head: Normocephalic and atraumatic  Eyes: Sclera and conjunctiva clear; pupils round and reactive to light; extraocular movements intact  Ears: External normal; canals clear; tympanic membranes normal  Oropharynx: Pink, supple. No suspicious lesions  Neck: Supple without thyromegaly, adenopathy  Lungs: Respirations unlabored; clear to auscultation bilaterally without wheeze, rales, rhonchi  CVS exam: normal rate and regular rhythm.  Abdomen: Soft; nontender; nondistended; normoactive bowel sounds; no masses or hepatosplenomegaly  Musculoskeletal: No deformities; no active joint inflammation  Extremities: No edema, cyanosis, clubbing  Vessels: Symmetric bilaterally  Neurologic: Alert and oriented; speech intact; face symmetrical; moves all extremities well; CNII-XII intact without focal deficit   Assessment:  1. PE (physical exam), annual   2. HYPERTENSION, BENIGN ESSENTIAL   3. Mixed hyperlipidemia   4. Type 2 diabetes mellitus without complication, without long-term current use of insulin ([place])   5. Prostate cancer screening   6. Chronic gout without tophus, unspecified cause, unspecified site   7. Dysfunction of both eustachian tubes     Plan:  Age appropriate preventive healthcare needs addressed; encouraged regular eye doctor and dental exams; encouraged regular exercise; will update labs and refills as needed today; follow-up to be determined; Trial of Augmentin and Prednisone for sinus issues/ refer to ENT;  This visit occurred during the SARS-CoV-2 public health emergency.  Safety protocols were in place, including  screening questions prior to the visit, additional usage of staff PPE, and extensive cleaning of exam room while observing appropriate contact time as indicated for disinfecting solutions.     No follow-ups on file.  Orders Placed This Encounter  Procedures  . CBC with Differential/Platelet    Standing Status:   Future    Number of Occurrences:   1    Standing Expiration Date:   07/01/2021  . Comp Met (CMET)    Standing Status:   Future    Number of Occurrences:   1    Standing Expiration Date:   07/01/2021  . Lipid panel    Standing Status:   Future    Number of Occurrences:   1    Standing Expiration Date:  07/01/2021  . Hemoglobin A1c    Standing Status:   Future    Number of Occurrences:   1    Standing Expiration Date:   07/01/2021  . PSA    Standing Status:   Future    Number of Occurrences:   1    Standing Expiration Date:   07/01/2021  . Uric acid    Standing Status:   Future    Number of Occurrences:   1    Standing Expiration Date:   07/01/2021  . Ambulatory referral to ENT    Referral Priority:   Routine    Referral Type:   Consultation    Referral Reason:   Specialty Services Required    Requested Specialty:   Otolaryngology    Number of Visits Requested:   1    Requested Prescriptions   Signed Prescriptions Disp Refills  . predniSONE (DELTASONE) 20 MG tablet 5 tablet 0    Sig: Take 1 tablet (20 mg total) by mouth daily with breakfast.  . amoxicillin-clavulanate (AUGMENTIN) 875-125 MG tablet 20 tablet 0    Sig: Take 1 tablet by mouth 2 (two) times daily for 10 days.

## 2020-07-01 NOTE — Patient Instructions (Signed)
Eustachian Tube Dysfunction ° °Eustachian tube dysfunction refers to a condition in which a blockage develops in the narrow passage that connects the middle ear to the back of the nose (eustachian tube). The eustachian tube regulates air pressure in the middle ear by letting air move between the ear and nose. It also helps to drain fluid from the middle ear space. °Eustachian tube dysfunction can affect one or both ears. When the eustachian tube does not function properly, air pressure, fluid, or both can build up in the middle ear. °What are the causes? °This condition occurs when the eustachian tube becomes blocked or cannot open normally. Common causes of this condition include: °· Ear infections. °· Colds and other infections that affect the nose, mouth, and throat (upper respiratory tract). °· Allergies. °· Irritation from cigarette smoke. °· Irritation from stomach acid coming up into the esophagus (gastroesophageal reflux). The esophagus is the tube that carries food from the mouth to the stomach. °· Sudden changes in air pressure, such as from descending in an airplane or scuba diving. °· Abnormal growths in the nose or throat, such as: °? Growths that line the nose (nasal polyps). °? Abnormal growth of cells (tumors). °? Enlarged tissue at the back of the throat (adenoids). °What increases the risk? °You are more likely to develop this condition if: °· You smoke. °· You are overweight. °· You are a child who has: °? Certain birth defects of the mouth, such as cleft palate. °? Large tonsils or adenoids. °What are the signs or symptoms? °Common symptoms of this condition include: °· A feeling of fullness in the ear. °· Ear pain. °· Clicking or popping noises in the ear. °· Ringing in the ear. °· Hearing loss. °· Loss of balance. °· Dizziness. °Symptoms may get worse when the air pressure around you changes, such as when you travel to an area of high elevation, fly on an airplane, or go scuba diving. °How is  this diagnosed? °This condition may be diagnosed based on: °· Your symptoms. °· A physical exam of your ears, nose, and throat. °· Tests, such as those that measure: °? The movement of your eardrum (tympanogram). °? Your hearing (audiometry). °How is this treated? °Treatment depends on the cause and severity of your condition. °· In mild cases, you may relieve your symptoms by moving air into your ears. This is called "popping the ears." °· In more severe cases, or if you have symptoms of fluid in your ears, treatment may include: °? Medicines to relieve congestion (decongestants). °? Medicines that treat allergies (antihistamines). °? Nasal sprays or ear drops that contain medicines that reduce swelling (steroids). °? A procedure to drain the fluid in your eardrum (myringotomy). In this procedure, a small tube is placed in the eardrum to: °§ Drain the fluid. °§ Restore the air in the middle ear space. °? A procedure to insert a balloon device through the nose to inflate the opening of the eustachian tube (balloon dilation). °Follow these instructions at home: °Lifestyle °· Do not do any of the following until your health care provider approves: °? Travel to high altitudes. °? Fly in airplanes. °? Work in a pressurized cabin or room. °? Scuba dive. °· Do not use any products that contain nicotine or tobacco, such as cigarettes and e-cigarettes. If you need help quitting, ask your health care provider. °· Keep your ears dry. Wear fitted earplugs during showering and bathing. Dry your ears completely after. °General instructions °· Take over-the-counter   and prescription medicines only as told by your health care provider. °· Use techniques to help pop your ears as recommended by your health care provider. These may include: °? Chewing gum. °? Yawning. °? Frequent, forceful swallowing. °? Closing your mouth, holding your nose closed, and gently blowing as if you are trying to blow air out of your nose. °· Keep all  follow-up visits as told by your health care provider. This is important. °Contact a health care provider if: °· Your symptoms do not go away after treatment. °· Your symptoms come back after treatment. °· You are unable to pop your ears. °· You have: °? A fever. °? Pain in your ear. °? Pain in your head or neck. °? Fluid draining from your ear. °· Your hearing suddenly changes. °· You become very dizzy. °· You lose your balance. °Summary °· Eustachian tube dysfunction refers to a condition in which a blockage develops in the eustachian tube. °· It can be caused by ear infections, allergies, inhaled irritants, or abnormal growths in the nose or throat. °· Symptoms include ear pain, hearing loss, or ringing in the ears. °· Mild cases are treated with maneuvers to unblock the ears, such as yawning or ear popping. °· Severe cases are treated with medicines. Surgery may also be done (rare). °This information is not intended to replace advice given to you by your health care provider. Make sure you discuss any questions you have with your health care provider. °Document Revised: 11/23/2017 Document Reviewed: 11/23/2017 °[employer]vier Patient Education © 2020 [employer] ° °

## 2020-07-02 ENCOUNTER — Ambulatory Visit (INDEPENDENT_AMBULATORY_CARE_PROVIDER_SITE_OTHER): Payer: Medicare HMO

## 2020-07-02 DIAGNOSIS — Z0000 Encounter for general adult medical examination without abnormal findings: Secondary | ICD-10-CM | POA: Diagnosis not present

## 2020-07-02 NOTE — Patient Instructions (Signed)
Brian [name] , Thank you for taking time to come for your Medicare Wellness Visit. I appreciate your ongoing commitment to your health goals. Please review the following plan we discussed and let me know if I can assist you in the future.   Screening recommendations/referrals: Colonoscopy: 12/06/2015; due every 5 years Recommended yearly ophthalmology/optometry visit for glaucoma screening and checkup Recommended yearly dental visit for hygiene and checkup  Vaccinations: Influenza vaccine: 05/15/2020 Pneumococcal vaccine: 04/26/2019, 05/17/2020 (up to date) Tdap vaccine: 10/15/2017; due every 10 years  Shingles vaccine: 03/28/2018, 05/31/2018 (up to date) Covid-19: 10/16/2019, 11/06/2019, 05/21/2020 (up to date with booster)  Advanced directives: Please bring a copy of your health care power of attorney and living will to the office at your convenience.  Conditions/risks identified: Yes; Reviewed health maintenance screenings with patient today and relevant [name], vaccines, and/or referrals were provided. Please continue to do your personal lifestyle choices by: daily care of teeth and gums, regular physical activity (goal should be 5 days a week for 30 minutes), eat a healthy diet, avoid tobacco and drug use, limiting any alcohol intake, taking a low-dose aspirin (if not allergic or have been advised by your provider otherwise) and taking vitamins and minerals as recommended by your provider. Continue doing brain stimulating activities (puzzles, reading, adult coloring books, staying active) to keep memory sharp. Continue to eat heart healthy diet (full of fruits, vegetables, whole grains, lean protein, water--limit salt, fat, and sugar intake) and increase physical activity as tolerated.  Next appointment: Please schedule your next Medicare Wellness Visit with your Nurse Health Advisor in 1 year by calling [phone].  Preventive Care 17 Years and Older, Male Preventive care refers to lifestyle  choices and visits with your health care provider that can promote health and wellness. What does preventive care include?  A yearly physical exam. This is also called an annual well check.  Dental exams once or twice a year.  Routine eye exams. Ask your health care provider how often you should have your eyes checked.  Personal lifestyle choices, including:  Daily care of your teeth and gums.  Regular physical activity.  Eating a healthy diet.  Avoiding tobacco and drug use.  Limiting alcohol use.  Practicing safe sex.  Taking low doses of aspirin every day.  Taking vitamin and mineral supplements as recommended by your health care provider. What happens during an annual well check? The services and screenings done by your health care provider during your annual well check will depend on your age, overall health, lifestyle risk factors, and family history of disease. Counseling  Your health care provider may ask you questions about your:  Alcohol use.  Tobacco use.  Drug use.  Emotional well-being.  [name] and relationship well-being.  Sexual activity.  Eating habits.  History of falls.  Memory and ability to understand (cognition).  Work and work Statistician. Screening  You may have the following tests or measurements:  Height, weight, and BMI.  Blood pressure.  Lipid and cholesterol levels. These may be checked every 5 years, or more frequently if you are over 30 years old.  Skin check.  Lung cancer screening. You may have this screening every year starting at age 70 if you have a 30-pack-year history of smoking and currently smoke or have quit within the past 15 years.  Fecal occult blood test (FOBT) of the stool. You may have this test every year starting at age 9.  Flexible sigmoidoscopy or colonoscopy. You may have a sigmoidoscopy  every 5 years or a colonoscopy every 10 years starting at age 43.  Prostate cancer screening. Recommendations will  vary depending on your family history and other risks.  Hepatitis C blood test.  Hepatitis B blood test.  Sexually transmitted disease (STD) testing.  Diabetes screening. This is done by checking your blood sugar (glucose) after you have not eaten for a while (fasting). You may have this done every 1-3 years.  Abdominal aortic aneurysm (AAA) screening. You may need this if you are a current or former smoker.  Osteoporosis. You may be screened starting at age 50 if you are at high risk. Talk with your health care provider about your test results, treatment options, and if necessary, the need for more tests. Vaccines  Your health care provider may recommend certain vaccines, such as:  Influenza vaccine. This is recommended every year.  Tetanus, diphtheria, and acellular pertussis (Tdap, Td) vaccine. You may need a Td booster every 10 years.  Zoster vaccine. You may need this after age 47.  Pneumococcal 13-valent conjugate (PCV13) vaccine. One dose is recommended after age 84.  Pneumococcal polysaccharide (PPSV23) vaccine. One dose is recommended after age 58. Talk to your health care provider about which screenings and vaccines you need and how often you need them. This information is not intended to replace advice given to you by your health care provider. Make sure you discuss any questions you have with your health care provider. Document Released: 08/30/2015 Document Revised: 04/22/2016 Document Reviewed: 06/04/2015 Brian [name]  2017 Brian [name] Falls can cause injuries. They can happen to people of all ages. There are many things you can do to make your [name] safe and to help prevent falls. What can I do on the outside of my [name]?  Regularly fix the edges of walkways and driveways and fix any cracks.  Remove anything that might make you trip as you walk through a door, such as a raised step or threshold.  Trim any  bushes or trees on the path to your [name].  Use bright outdoor lighting.  Clear any walking paths of anything that might make someone trip, such as rocks or tools.  Regularly check to see if handrails are loose or broken. Make sure that both sides of any steps have handrails.  Any raised decks and porches should have guardrails on the edges.  Have any leaves, snow, or ice cleared regularly.  Use sand or salt on walking paths during winter.  Clean up any spills in your garage right away. This includes oil or grease spills. What can I do in the bathroom?  Use night lights.  Install grab bars by the toilet and in the tub and shower. Do not use towel bars as grab bars.  Use non-skid mats or decals in the tub or shower.  If you need to sit down in the shower, use a plastic, non-slip stool.  Keep the floor dry. Clean up any water that spills on the floor as soon as it happens.  Remove soap buildup in the tub or shower regularly.  Attach bath mats securely with double-sided non-slip rug tape.  Do not have throw rugs and other things on the floor that can make you trip. What can I do in the bedroom?  Use night lights.  Make sure that you have a light by your bed that is easy to reach.  Do not use any sheets or blankets that are too  big for your bed. They should not hang down onto the floor.  Have a firm chair that has side arms. You can use this for support while you get dressed.  Do not have throw rugs and other things on the floor that can make you trip. What can I do in the kitchen?  Clean up any spills right away.  Avoid walking on wet floors.  Keep items that you use a lot in easy-to-reach places.  If you need to reach something above you, use a strong step stool that has a grab bar.  Keep electrical cords out of the way.  Do not use floor polish or wax that makes floors slippery. If you must use wax, use non-skid floor wax.  Do not have throw rugs and other  things on the floor that can make you trip. What can I do with my stairs?  Do not leave any items on the stairs.  Make sure that there are handrails on both sides of the stairs and use them. Fix handrails that are broken or loose. Make sure that handrails are as long as the stairways.  Check any carpeting to make sure that it is firmly attached to the stairs. Fix any carpet that is loose or worn.  Avoid having throw rugs at the top or bottom of the stairs. If you do have throw rugs, attach them to the floor with carpet tape.  Make sure that you have a light switch at the top of the stairs and the bottom of the stairs. If you do not have them, ask someone to add them for you. What else can I do to help prevent falls?  Wear shoes that:  Do not have high heels.  Have rubber bottoms.  Are comfortable and fit you well.  Are closed at the toe. Do not wear sandals.  If you use a stepladder:  Make sure that it is fully opened. Do not climb a closed stepladder.  Make sure that both sides of the stepladder are locked into place.  Ask someone to hold it for you, if possible.  Clearly mark and make sure that you can see:  Any grab bars or handrails.  First and last steps.  Where the edge of each step is.  Use tools that help you move around (mobility aids) if they are needed. These include:  Canes.  Walkers.  Scooters.  Crutches.  Turn on the lights when you go into a dark area. Replace any light bulbs as soon as they burn out.  Set up your furniture so you have a clear path. Avoid moving your furniture around.  If any of your floors are uneven, fix them.  If there are any pets around you, be aware of where they are.  Review your medicines with your doctor. Some medicines can make you feel dizzy. This can increase your chance of falling. Ask your doctor what other things that you can do to help prevent falls. This information is not intended to replace advice given to  you by your health care provider. Make sure you discuss any questions you have with your health care provider. Document Released: 05/30/2009 Document Revised: 01/09/2016 Document Reviewed: 09/07/2014 Brian [name]  2017 Brian [name].

## 2020-07-02 NOTE — Progress Notes (Addendum)
I connected with Brian [name] today by telephone and verified that I am speaking with the correct person using two identifiers. Location patient: home Location provider: work Persons participating in the virtual visit:  Brian [name], wife and [employer], [place].   I discussed the limitations, risks, security and privacy concerns of performing an evaluation and management service by telephone and the availability of in person appointments. I also discussed with the patient that there may be a patient responsible charge related to this service. The patient expressed understanding and verbally consented to this telephonic visit.    Interactive audio and video telecommunications were attempted between this provider and patient, however failed, due to patient having technical difficulties OR patient did not have access to video capability.  We continued and completed visit with audio only.  Some vital signs may be absent or patient reported.   Time Spent with patient on telephone encounter: 30 minutes  Subjective:   Brian [name] is a 69 y.o. male who presents for Medicare Annual/Subsequent preventive examination.  Review of Systems    No ROS. Medicare Wellness Visit. Additional risk factors are reflected in social history. Cardiac Risk Factors include: advanced age (>88men, >90 women);diabetes mellitus;dyslipidemia;family history of premature cardiovascular disease;hypertension;male gender;obesity (BMI >30kg/m2) Sleep Patterns: No sleep issues, feels rested on waking and sleeps 6-8 hours nightly. Home Safety/Smoke Alarms: Feels safe in home; uses home alarm. Smoke alarms in place. Living environment: 2-story home; Lives with spouse; no needs for DME; good support system. Seat Belt Safety/Bike Helmet: Wears seat belt.     Objective:    There were no vitals filed for this visit. There is no height or weight on file to calculate BMI.  Advanced Directives 07/02/2020  12/06/2015 11/22/2015  Does Patient Have a Medical Advance Directive? Yes Yes Yes  Type of Advance Directive Living will;Healthcare Power of Attorney Living will Living will;Healthcare Power of Attorney  Does patient want to make changes to medical advance directive? No - Patient declined - -  Copy of [place] in Chart? No - copy requested No - copy requested -    Current Medications (verified) Outpatient Encounter Medications as of 07/02/2020  Medication Sig  . allopurinol (ZYLOPRIM) 100 MG tablet Take 1 tablet by mouth once daily  . amoxicillin-clavulanate (AUGMENTIN) 875-125 MG tablet Take 1 tablet by mouth 2 (two) times daily for 10 days.  Marland Kitchen aspirin 81 MG chewable tablet Chew 81 mg by mouth daily.  Marland Kitchen atorvastatin (LIPITOR) 20 MG tablet Take 1 tablet by mouth once daily  . Azelastine HCl 137 MCG/SPRAY SOLN USE 2 SPRAY(S) IN EACH NOSTRIL TWICE DAILY AS DIRECTED  . carvedilol (COREG) 6.25 MG tablet TAKE 1 TABLET BY MOUTH TWICE DAILY WITH MEALS  . fluticasone (FLONASE) 50 MCG/ACT nasal spray Use 2 spray(s) in each nostril once daily  . gabapentin (NEURONTIN) 100 MG capsule 1 tablet in the am and 2 in the pm;  . glimepiride (AMARYL) 1 MG tablet Take 1 tablet by mouth twice daily  . hydrochlorothiazide (HYDRODIURIL) 25 MG tablet TAKE 1 TABLET BY MOUTH ONCE DAILY FOR HIGH BLOOD PRESSURE  . levocetirizine (XYZAL) 5 MG tablet TAKE 1 TABLET BY MOUTH ONCE DAILY IN THE EVENING  . lisinopril (ZESTRIL) 2.5 MG tablet Take 1 tablet by mouth once daily  . metFORMIN (GLUCOPHAGE) 1000 MG tablet TAKE 1 TABLET BY MOUTH TWICE DAILY WITH MEALS  . Multiple Vitamin (MULTIVITAMIN WITH MINERALS) TABS tablet Take 1 tablet by mouth daily.  Marland Kitchen  Omega-3 Fatty Acids (FISH OIL) 1000 MG CAPS Take 1,000 mg by mouth daily.  . predniSONE (DELTASONE) 20 MG tablet Take 1 tablet (20 mg total) by mouth daily with breakfast.  . sildenafil (REVATIO) 20 MG tablet Take 20 mg by mouth daily as needed.  . vitamin  B-12 (CYANOCOBALAMIN) 1000 MCG tablet Take 1,000 mcg by mouth daily.   No facility-administered encounter medications on file as of 07/02/2020.    Allergies (verified) Patient has no known allergies.   History: Past Medical History:  Diagnosis Date  . Blood transfusion without reported diagnosis    with nasal sinus surgery  . Hyperlipidemia   . Hypertension    Past Surgical History:  Procedure Laterality Date  . ABDOMINAL SURGERY     scrapnel removed   . COLONOSCOPY  03/2005   Alphonsa Overall, polyp  . EYE SURGERY     x 7 removed metal from eyes - shrapnel  . HEMORROIDECTOMY    . left shoulder surgery    . NASAL SINUS SURGERY    . right shoulder surgery     Family History  Problem Relation Age of Onset  . Colon cancer Neg Hx   . Colon polyps Neg Hx   . Esophageal cancer Neg Hx   . Rectal cancer Neg Hx   . Stomach cancer Neg Hx   . Diabetes Sister   . Heart disease Brother    Social History   Socioeconomic History  . Marital status: Married    Spouse name: Not on file  . Number of children: Not on file  . Years of education: Not on file  . Highest education level: Not on file  Occupational History  . Not on file  Tobacco Use  . Smoking status: Former Smoker    Packs/day: 5.00    Years: 4.00    Pack years: 20.00    Types: Cigarettes    Quit date: 08/17/1986    Years since quitting: 33.8  . Smokeless tobacco: Never Used  Substance and Sexual Activity  . Alcohol use: Yes    Alcohol/week: 10.0 standard drinks    Types: 10 Glasses of wine per week  . Drug use: No  . Sexual activity: Not on file  Other Topics Concern  . Not on file  Social History Narrative  . Not on file   Social Determinants of Health   Financial Resource Strain: Low Risk   . Difficulty of Paying Living Expenses: Not hard at all  Food Insecurity: No Food Insecurity  . Worried About Charity fundraiser in the Last Year: Never true  . Ran Out of Food in the Last Year: Never true   Transportation Needs: No Transportation Needs  . Lack of Transportation (Medical): No  . Lack of Transportation (Non-Medical): No  Physical Activity: Sufficiently Active  . Days of Exercise per Week: 5 days  . Minutes of Exercise per Session: 30 min  Stress: No Stress Concern Present  . Feeling of Stress : Not at all  Social Connections: Socially Integrated  . Frequency of Communication with Friends and Family: More than three times a week  . Frequency of Social Gatherings with Friends and Family: More than three times a week  . Attends Religious Services: More than 4 times per year  . Active Member of Clubs or Organizations: Yes  . Attends Archivist Meetings: More than 4 times per year  . Marital Status: Married    Tobacco Counseling Counseling given: Not Answered  Clinical Intake:  Pre-visit preparation completed: Yes  Pain : No/denies pain     Nutritional Risks: None Diabetes: Yes CBG done?: No Did pt. bring in CBG monitor from home?: No  How often do you need to have someone help you when you read instructions, pamphlets, or other written materials from your doctor or pharmacy?: 1 - Never What is the last grade level you completed in school?: HSG; 3 years of college courses  Diabetic? yes  Interpreter Needed?: No  Information entered by :: Parthenia Ames, LPN   Activities of Daily Living In your present state of health, do you have any difficulty performing the following activities: 07/02/2020 07/01/2020  Hearing? Tempie Donning  Vision? N N  Difficulty concentrating or making decisions? N N  Walking or climbing stairs? N N  Dressing or bathing? N N  Doing errands, shopping? N N  Preparing Food and eating ? N -  Using the Toilet? N -  In the past six months, have you accidently leaked urine? N -  Do you have problems with loss of bowel control? N -  Managing your Medications? N -  Managing your Finances? N -  Housekeeping or managing your  Housekeeping? N -  Some recent data might be hidden    Patient Care Team: Marrian Salvage, [place] as PCP - General (Internal Medicine)  Indicate any recent Medical Services you may have received from other than [facility] providers in the past year (date may be approximate).     Assessment:   This is a routine wellness examination for Kay.  Hearing/Vision screen No exam data present  Dietary issues and exercise activities discussed: Current Exercise Habits: Home exercise routine, Type of exercise: walking, Time (Minutes): 30, Frequency (Times/Week): 5, Weekly Exercise (Minutes/Week): 150, Intensity: Mild, Exercise limited by: respiratory conditions(s)  Goals    .  Patient Stated (pt-stated)      My goal is to lose at least 20 pounds.      Depression Screen PHQ 2/9 Scores 07/02/2020 07/01/2020 06/30/2019  PHQ - 2 Score 0 0 0    Fall Risk Fall Risk  07/02/2020 07/01/2020 03/28/2018  Falls in the past year? 1 1 No  Number falls in past yr: 1 1 -  Injury with Fall? 0 0 -  Follow up Falls evaluation completed - -    Any stairs in or around the home? Yes  If so, are there any without handrails? No  Home free of loose throw rugs in walkways, pet beds, electrical cords, etc? Yes  Adequate lighting in your home to reduce risk of falls? Yes   ASSISTIVE DEVICES UTILIZED TO PREVENT FALLS:  Life alert? No  Use of a cane, walker or w/c? No  Grab bars in the bathroom? No  Shower chair or bench in shower? Yes  Elevated toilet seat or a handicapped toilet? Yes   TIMED UP AND GO:  Was the test performed? No .  Length of time to ambulate 10 feet: 0 sec.   Gait steady and fast without use of assistive device  Cognitive Function: Patient is cogitatively intact.        Immunizations  Immunization History  Administered Date(s) Administered  . Influenza, High Dose Seasonal PF 05/31/2018, 04/26/2019  . Influenza-Unspecified 05/17/2020  . Pneumococcal Conjugate-13 04/26/2019   . Pneumococcal Polysaccharide-23 05/17/2020  . Zoster Recombinat (Shingrix) 03/28/2018, 05/31/2018    TDAP status: Up to date Flu Vaccine status: Up to date Pneumococcal vaccine status: Up to date  Covid-19 vaccine status: Completed vaccines  Qualifies for Shingles Vaccine? Yes   Zostavax completed No   Shingrix Completed?: Yes  Screening Tests Health Maintenance  Topic Date Due  . Hepatitis C Screening  Never done  . COVID-19 Vaccine (1) Never done  . FOOT EXAM  09/29/2019  . OPHTHALMOLOGY EXAM  10/01/2020  . COLONOSCOPY  12/05/2020  . HEMOGLOBIN A1C  12/29/2020  . TETANUS/TDAP  10/16/2027  . INFLUENZA VACCINE  Completed  . PNA vac Low Risk Adult  Completed    Health Maintenance  Health Maintenance Due  Topic Date Due  . Hepatitis C Screening  Never done  . COVID-19 Vaccine (1) Never done  . FOOT EXAM  09/29/2019    Colorectal cancer screening: Completed 12/06/2015. Repeat every 5 years  Lung Cancer Screening: (Low Dose CT Chest recommended if Age 37-80 years, 30 pack-year currently smoking OR have quit w/in 15years.) does not qualify.   Lung Cancer Screening Referral: no  Additional Screening:  Hepatitis C Screening: does qualify; Completed: never done  Vision Screening: Recommended annual ophthalmology exams for early detection of glaucoma and other disorders of the eye. Is the patient up to date with their annual eye exam?  Yes  Who is the provider or what is the name of the office in which the patient attends annual eye exams? [place], [place]. If pt is not established with a provider, would they like to be referred to a provider to establish care? No .   Dental Screening: Recommended annual dental exams for proper oral hygiene  Community Resource Referral / Chronic Care Management: CRR required this visit?  No   CCM required this visit?  No      Plan:     I have personally reviewed and noted the following in the patient's chart:   . Medical and  social history . Use of alcohol, tobacco or illicit drugs  . Current medications and supplements . Functional ability and status . Nutritional status . Physical activity . Advanced directives . List of other physicians . Hospitalizations, surgeries, and ER visits in previous 12 months . Vitals . Screenings to include cognitive, depression, and falls . Referrals and appointments  In addition, I have reviewed and discussed with patient certain preventive protocols, quality metrics, and best practice recommendations. A written personalized care plan for preventive services as well as general preventive health recommendations were provided to patient.     Sheral Flow, LPN   16/05/[id number]   Nurse Notes:  Patient is cogitatively intact. There were no vitals filed for this visit. There is no height or weight on file to calculate BMI. Patient stated that he has no issues with gait or balance; does not use any assistive devices.  Medical screening examination/treatment/procedure(s) were performed by non-physician practitioner and as supervising provider I was immediately available for consultation/collaboration.  I agree with above. Marrian Salvage, FNP

## 2020-07-07 ENCOUNTER — Other Ambulatory Visit: Payer: Self-pay | Admitting: Family

## 2020-07-22 ENCOUNTER — Other Ambulatory Visit: Payer: Self-pay | Admitting: Family

## 2020-08-17 ENCOUNTER — Other Ambulatory Visit: Payer: Self-pay | Admitting: Family

## 2020-09-03 ENCOUNTER — Other Ambulatory Visit: Payer: Self-pay | Admitting: Family

## 2020-09-16 DIAGNOSIS — Z03818 Encounter for observation for suspected exposure to other biological agents ruled out: Secondary | ICD-10-CM | POA: Diagnosis not present

## 2020-09-16 DIAGNOSIS — Z20822 Contact with and (suspected) exposure to covid-19: Secondary | ICD-10-CM | POA: Diagnosis not present

## 2020-10-07 ENCOUNTER — Encounter: Payer: Self-pay | Admitting: Family

## 2020-10-07 ENCOUNTER — Other Ambulatory Visit: Payer: Self-pay | Admitting: Family

## 2020-10-07 ENCOUNTER — Ambulatory Visit (INDEPENDENT_AMBULATORY_CARE_PROVIDER_SITE_OTHER): Payer: Medicare HMO | Admitting: Family

## 2020-10-07 ENCOUNTER — Other Ambulatory Visit: Payer: Self-pay

## 2020-10-07 VITALS — BP 130/78 | HR 74 | Temp 97.7°F | Resp 18 | Ht 72.0 in | Wt 279.0 lb

## 2020-10-07 DIAGNOSIS — M25562 Pain in left knee: Secondary | ICD-10-CM

## 2020-10-07 DIAGNOSIS — J329 Chronic sinusitis, unspecified: Secondary | ICD-10-CM | POA: Diagnosis not present

## 2020-10-07 DIAGNOSIS — G8929 Other chronic pain: Secondary | ICD-10-CM | POA: Diagnosis not present

## 2020-10-07 DIAGNOSIS — M25561 Pain in right knee: Secondary | ICD-10-CM

## 2020-10-07 DIAGNOSIS — G629 Polyneuropathy, unspecified: Secondary | ICD-10-CM | POA: Diagnosis not present

## 2020-10-07 DIAGNOSIS — E119 Type 2 diabetes mellitus without complications: Secondary | ICD-10-CM | POA: Diagnosis not present

## 2020-10-07 DIAGNOSIS — R945 Abnormal results of liver function studies: Secondary | ICD-10-CM

## 2020-10-07 DIAGNOSIS — R7989 Other specified abnormal findings of blood chemistry: Secondary | ICD-10-CM

## 2020-10-07 LAB — COMPREHENSIVE METABOLIC PANEL
ALT: 42 U/L (ref 0–53)
AST: 38 U/L — ABNORMAL HIGH (ref 0–37)
Albumin: 4.4 g/dL (ref 3.5–5.2)
Alkaline Phosphatase: 83 U/L (ref 39–117)
BUN: 15 mg/dL (ref 6–23)
CO2: 30 mEq/L (ref 19–32)
Calcium: 9.7 mg/dL (ref 8.4–10.5)
Chloride: 93 mEq/L — ABNORMAL LOW (ref 96–112)
Creatinine, Ser: 1.11 mg/dL (ref 0.40–1.50)
GFR: 66.73 mL/min (ref >60.00)
Glucose, Bld: 205 mg/dL — ABNORMAL HIGH (ref 70–99)
Potassium: 4.3 mEq/L (ref 3.5–5.1)
Sodium: 132 mEq/L — ABNORMAL LOW (ref 135–145)
Total Bilirubin: 1.7 mg/dL — ABNORMAL HIGH (ref 0.2–1.2)
Total Protein: 7.1 g/dL (ref 6.0–8.3)

## 2020-10-07 LAB — HEMOGLOBIN A1C: Hgb A1c MFr Bld: 7.4 % — ABNORMAL HIGH (ref 4.6–6.5)

## 2020-10-07 NOTE — Progress Notes (Signed)
Brian [name] is a 70 y.o. male with the following history as recorded in EpicCare:  Patient Active Problem List   Diagnosis Date Noted  . Toe injury, left, initial encounter 06/12/2019  . DYSPNEA ON EXERTION 07/27/2008  . ELECTROCARDIOGRAM, ABNORMAL 07/27/2008  . DIABETES-TYPE 2 05/07/2008  . Hyperlipidemia 05/07/2008  . ERECTILE DYSFUNCTION 05/07/2008  . HYPERTENSION, BENIGN ESSENTIAL 05/07/2008    Current Outpatient Medications  Medication Sig Dispense Refill  . allopurinol (ZYLOPRIM) 100 MG tablet Take 1 tablet by mouth once daily 90 tablet 0  . aspirin 81 MG chewable tablet Chew 81 mg by mouth daily.    Marland Kitchen atorvastatin (LIPITOR) 20 MG tablet Take 1 tablet by mouth once daily 90 tablet 0  . Azelastine HCl 137 MCG/SPRAY SOLN USE 2 SPRAY(S) IN EACH NOSTRIL TWICE DAILY AS DIRECTED 30 mL 0  . carvedilol (COREG) 6.25 MG tablet TAKE 1 TABLET BY MOUTH TWICE DAILY WITH MEALS 180 tablet 0  . fluticasone (FLONASE) 50 MCG/ACT nasal spray Use 2 spray(s) in each nostril once daily 16 g 0  . gabapentin (NEURONTIN) 100 MG capsule TAKE 1 CAPSULE BY MOUTH IN THE MORNING AND 2 IN THE EVENING 270 capsule 0  . glimepiride (AMARYL) 1 MG tablet Take 1 tablet by mouth twice daily 180 tablet 0  . hydrochlorothiazide (HYDRODIURIL) 25 MG tablet TAKE 1 TABLET BY MOUTH ONCE DAILY FOR HIGH BLOOD PRESSURE 90 tablet 0  . levocetirizine (XYZAL) 5 MG tablet TAKE 1 TABLET BY MOUTH ONCE DAILY IN THE EVENING 90 tablet 0  . lisinopril (ZESTRIL) 2.5 MG tablet Take 1 tablet by mouth once daily 90 tablet 0  . metFORMIN (GLUCOPHAGE) 1000 MG tablet TAKE 1 TABLET BY MOUTH TWICE DAILY WITH MEALS 180 tablet 0  . Multiple Vitamin (MULTIVITAMIN WITH MINERALS) TABS tablet Take 1 tablet by mouth daily.    . Omega-3 Fatty Acids (FISH OIL) 1000 MG CAPS Take 1,000 mg by mouth daily.    . sildenafil (REVATIO) 20 MG tablet Take 20 mg by mouth daily as needed.    . vitamin B-12 (CYANOCOBALAMIN) 1000 MCG tablet Take 1,000 mcg by  mouth daily.     No current facility-administered medications for this visit.    Allergies: Patient has no known allergies.  Past Medical History:  Diagnosis Date  . Blood transfusion without reported diagnosis    with nasal sinus surgery  . Hyperlipidemia   . Hypertension     Past Surgical History:  Procedure Laterality Date  . ABDOMINAL SURGERY     scrapnel removed   . COLONOSCOPY  03/2005   Alphonsa Overall, polyp  . EYE SURGERY     x 7 removed metal from eyes - shrapnel  . HEMORROIDECTOMY    . left shoulder surgery    . NASAL SINUS SURGERY    . right shoulder surgery      Family History  Problem Relation Age of Onset  . Colon cancer Neg Hx   . Colon polyps Neg Hx   . Esophageal cancer Neg Hx   . Rectal cancer Neg Hx   . Stomach cancer Neg Hx   . Diabetes Sister   . Heart disease Brother     Social History   Tobacco Use  . Smoking status: Former Smoker    Packs/day: 5.00    Years: 4.00    Pack years: 20.00    Types: Cigarettes    Quit date: 08/17/1986    Years since quitting: 34.1  . Smokeless tobacco: Never  Used  Substance Use Topics  . Alcohol use: Yes    Alcohol/week: 10.0 standard drinks    Types: 10 Glasses of wine per week    Subjective:   3 month follow up on Type 2 diabetes; last Hgba1c was at 8.0- he did not want to change his medications/ wanted to stay on current regimen and work on diet/ exercise; Denies any chest pain, shortness of breath, blurred vision or headache  Recently returned from 3 week cruise to [place]- tried to make good food choices/ stay active on the ship; weight stable from November 2021;  Planning to start to go back to gym tomorrow; admits feeling more stiff since not being as active;   ENT referral was done at last OV- per patient, he was never contacted to schedule; continuing with chronic sinus congestion;     Objective:  Vitals:   10/07/20 0846  BP: 130/78  Pulse: 74  Resp: 18  Temp: 97.7 F (36.5 C)  TempSrc: Oral   SpO2: 96%  Weight: 279 lb (126.6 kg)  Height: 6' (1.829 m)    General: Well developed, well nourished, in no acute distress  Skin : Warm and dry.  Head: Normocephalic and atraumatic  Eyes: Sclera and conjunctiva clear; pupils round and reactive to light; extraocular movements intact  Lungs: Respirations unlabored;  CVS exam: normal rate and regular rhythm.  Musculoskeletal: No deformities; no active joint inflammation  Extremities: No edema, cyanosis, clubbing  Vessels: Symmetric bilaterally  Neurologic: Alert and oriented; speech intact; face symmetrical; moves all extremities well; CNII-XII intact without focal deficit   Assessment:  1. Chronic sinusitis, unspecified location   2. Type 2 diabetes mellitus without complication, without long-term current use of insulin ([place])   3. Neuropathy   4. Chronic pain of both knees     Plan:  1. Referral done to ENT again;  2. Check labs today; patient has not wanted to change his regimen in the past- Hgba1c needs to be below 8; encouraged exercise; 3. Taking Gabapentin tid; can increase- hopefully increased exercise can help; patient is describing pain in his elbow that sounds more musculoskeletal but he defers seeing specialist at this time; 4. Defers seeing specialist at this time; wants to try exercise first;  Time spent 35 minutes discussed patient's concerns/ developing treatment plan   This visit occurred during the SARS-CoV-2 public health emergency.  Safety protocols were in place, including screening questions prior to the visit, additional usage of staff PPE, and extensive cleaning of exam room while observing appropriate contact time as indicated for disinfecting solutions.     No follow-ups on file.  Orders Placed This Encounter  Procedures  . Comp Met (CMET)    Standing Status:   Future    Number of Occurrences:   1    Standing Expiration Date:   10/07/2021  . Hemoglobin A1c    Standing Status:   Future    Number of  Occurrences:   1    Standing Expiration Date:   10/07/2021  . Ambulatory referral to ENT    Referral Priority:   Routine    Referral Type:   Consultation    Referral Reason:   Specialty Services Required    Requested Specialty:   Otolaryngology    Number of Visits Requested:   1    Requested Prescriptions    No prescriptions requested or ordered in this encounter

## 2020-10-22 ENCOUNTER — Other Ambulatory Visit (INDEPENDENT_AMBULATORY_CARE_PROVIDER_SITE_OTHER): Payer: Medicare HMO

## 2020-10-22 DIAGNOSIS — R7989 Other specified abnormal findings of blood chemistry: Secondary | ICD-10-CM

## 2020-10-22 DIAGNOSIS — R945 Abnormal results of liver function studies: Secondary | ICD-10-CM | POA: Diagnosis not present

## 2020-10-22 LAB — HEPATIC FUNCTION PANEL
ALT: 41 U/L (ref 0–53)
AST: 35 U/L (ref 0–37)
Albumin: 4.3 g/dL (ref 3.5–5.2)
Alkaline Phosphatase: 82 U/L (ref 39–117)
Bilirubin, Direct: 0.2 mg/dL (ref 0.0–0.3)
Total Bilirubin: 1.4 mg/dL — ABNORMAL HIGH (ref 0.2–1.2)
Total Protein: 7.2 g/dL (ref 6.0–8.3)

## 2020-10-29 DIAGNOSIS — J310 Chronic rhinitis: Secondary | ICD-10-CM | POA: Diagnosis not present

## 2020-10-29 DIAGNOSIS — Z9889 Other specified postprocedural states: Secondary | ICD-10-CM | POA: Diagnosis not present

## 2020-11-25 ENCOUNTER — Other Ambulatory Visit: Payer: Self-pay | Admitting: Family

## 2020-11-29 DIAGNOSIS — J310 Chronic rhinitis: Secondary | ICD-10-CM | POA: Diagnosis not present

## 2020-11-29 DIAGNOSIS — H938X1 Other specified disorders of right ear: Secondary | ICD-10-CM | POA: Diagnosis not present

## 2020-12-06 ENCOUNTER — Other Ambulatory Visit: Payer: Self-pay | Admitting: Family

## 2020-12-06 NOTE — Telephone Encounter (Signed)
I have called pt and he stated that he was going to keep Brian [name] as his PCP and come to the [facility] location. He has made a follow up appt for 03/07/21 @ 8:40am.   Rx has been sent to pharmacy.

## 2020-12-18 ENCOUNTER — Other Ambulatory Visit: Payer: Self-pay | Admitting: Family

## 2020-12-20 ENCOUNTER — Other Ambulatory Visit: Payer: Self-pay | Admitting: Family

## 2021-01-06 ENCOUNTER — Other Ambulatory Visit: Payer: Self-pay | Admitting: Family

## 2021-01-06 DIAGNOSIS — J309 Allergic rhinitis, unspecified: Secondary | ICD-10-CM | POA: Diagnosis not present

## 2021-01-06 DIAGNOSIS — J300 Vasomotor rhinitis: Secondary | ICD-10-CM | POA: Diagnosis not present

## 2021-01-20 ENCOUNTER — Other Ambulatory Visit: Payer: Self-pay | Admitting: Family

## 2021-01-21 DIAGNOSIS — H2513 Age-related nuclear cataract, bilateral: Secondary | ICD-10-CM | POA: Diagnosis not present

## 2021-01-21 DIAGNOSIS — H5203 Hypermetropia, bilateral: Secondary | ICD-10-CM | POA: Diagnosis not present

## 2021-01-21 DIAGNOSIS — H04123 Dry eye syndrome of bilateral lacrimal glands: Secondary | ICD-10-CM | POA: Diagnosis not present

## 2021-02-08 ENCOUNTER — Other Ambulatory Visit: Payer: Self-pay | Admitting: Family

## 2021-02-10 DIAGNOSIS — L570 Actinic keratosis: Secondary | ICD-10-CM | POA: Diagnosis not present

## 2021-02-10 DIAGNOSIS — L821 Other seborrheic keratosis: Secondary | ICD-10-CM | POA: Diagnosis not present

## 2021-02-10 DIAGNOSIS — D2262 Melanocytic nevi of left upper limb, including shoulder: Secondary | ICD-10-CM | POA: Diagnosis not present

## 2021-02-10 DIAGNOSIS — L814 Other melanin hyperpigmentation: Secondary | ICD-10-CM | POA: Diagnosis not present

## 2021-02-10 DIAGNOSIS — D225 Melanocytic nevi of trunk: Secondary | ICD-10-CM | POA: Diagnosis not present

## 2021-02-10 DIAGNOSIS — D485 Neoplasm of uncertain behavior of skin: Secondary | ICD-10-CM | POA: Diagnosis not present

## 2021-02-10 DIAGNOSIS — C44519 Basal cell carcinoma of skin of other part of trunk: Secondary | ICD-10-CM | POA: Diagnosis not present

## 2021-02-10 DIAGNOSIS — L578 Other skin changes due to chronic exposure to nonionizing radiation: Secondary | ICD-10-CM | POA: Diagnosis not present

## 2021-03-04 DIAGNOSIS — H25043 Posterior subcapsular polar age-related cataract, bilateral: Secondary | ICD-10-CM | POA: Diagnosis not present

## 2021-03-04 DIAGNOSIS — H25013 Cortical age-related cataract, bilateral: Secondary | ICD-10-CM | POA: Diagnosis not present

## 2021-03-04 DIAGNOSIS — H2513 Age-related nuclear cataract, bilateral: Secondary | ICD-10-CM | POA: Diagnosis not present

## 2021-03-04 DIAGNOSIS — H18413 Arcus senilis, bilateral: Secondary | ICD-10-CM | POA: Diagnosis not present

## 2021-03-04 DIAGNOSIS — H2511 Age-related nuclear cataract, right eye: Secondary | ICD-10-CM | POA: Diagnosis not present

## 2021-03-07 ENCOUNTER — Encounter: Payer: Self-pay | Admitting: Family

## 2021-03-07 ENCOUNTER — Other Ambulatory Visit: Payer: Self-pay

## 2021-03-07 ENCOUNTER — Ambulatory Visit (INDEPENDENT_AMBULATORY_CARE_PROVIDER_SITE_OTHER): Payer: Medicare HMO | Admitting: Family

## 2021-03-07 VITALS — BP 130/70 | HR 72 | Temp 98.0°F | Ht 73.0 in | Wt 275.0 lb

## 2021-03-07 DIAGNOSIS — E119 Type 2 diabetes mellitus without complications: Secondary | ICD-10-CM | POA: Diagnosis not present

## 2021-03-07 DIAGNOSIS — N529 Male erectile dysfunction, unspecified: Secondary | ICD-10-CM

## 2021-03-07 DIAGNOSIS — J309 Allergic rhinitis, unspecified: Secondary | ICD-10-CM

## 2021-03-07 DIAGNOSIS — I10 Essential (primary) hypertension: Secondary | ICD-10-CM | POA: Diagnosis not present

## 2021-03-07 LAB — COMPREHENSIVE METABOLIC PANEL
ALT: 40 U/L (ref 0–53)
AST: 32 U/L (ref 0–37)
Albumin: 4.4 g/dL (ref 3.5–5.2)
Alkaline Phosphatase: 84 U/L (ref 39–117)
BUN: 22 mg/dL (ref 6–23)
CO2: 30 mEq/L (ref 19–32)
Calcium: 9.9 mg/dL (ref 8.4–10.5)
Chloride: 97 mEq/L (ref 96–112)
Creatinine, Ser: 1.04 mg/dL (ref 0.40–1.50)
GFR: 71.95 mL/min (ref >60.00)
Glucose, Bld: 210 mg/dL — ABNORMAL HIGH (ref 70–99)
Potassium: 4.9 mEq/L (ref 3.5–5.1)
Sodium: 135 mEq/L (ref 135–145)
Total Bilirubin: 1.6 mg/dL — ABNORMAL HIGH (ref 0.2–1.2)
Total Protein: 6.8 g/dL (ref 6.0–8.3)

## 2021-03-07 LAB — HEMOGLOBIN A1C: Hgb A1c MFr Bld: 8.1 % — ABNORMAL HIGH (ref 4.6–6.5)

## 2021-03-07 MED ORDER — SILDENAFIL CITRATE 20 MG PO TABS: 20.0000 mg | ORAL_TABLET | Freq: Every day | ORAL | 1 refills | Status: DC | PRN

## 2021-03-07 MED ORDER — AZELASTINE-FLUTICASONE 137-50 MCG/ACT NA SUSP: 1.0000 | Freq: Two times a day (BID) | NASAL | 3 refills | Status: DC

## 2021-03-07 NOTE — Progress Notes (Signed)
Brian [name] is a 70 y.o. male with the following history as recorded in EpicCare:  Patient Active Problem List   Diagnosis Date Noted   Toe injury, left, initial encounter 06/12/2019   DYSPNEA ON EXERTION 07/27/2008   ELECTROCARDIOGRAM, ABNORMAL 07/27/2008   DIABETES-TYPE 2 05/07/2008   Hyperlipidemia 05/07/2008   ERECTILE DYSFUNCTION 05/07/2008   HYPERTENSION, BENIGN ESSENTIAL 05/07/2008    Current Outpatient Medications  Medication Sig Dispense Refill   allopurinol (ZYLOPRIM) 100 MG tablet Take 1 tablet by mouth once daily 90 tablet 1   aspirin 81 MG chewable tablet Chew 81 mg by mouth daily.     atorvastatin (LIPITOR) 20 MG tablet Take 1 tablet by mouth once daily 90 tablet 1   Azelastine HCl 137 MCG/SPRAY SOLN USE 2 SPRAY(S) IN EACH NOSTRIL TWICE DAILY AS DIRECTED 30 mL 0   Azelastine-Fluticasone 137-50 MCG/ACT SUSP Place 1 spray into the nose every 12 (twelve) hours. 23 g 3   carvedilol (COREG) 6.25 MG tablet TAKE 1 TABLET BY MOUTH TWICE DAILY WITH MEALS 180 tablet 0   fluticasone (FLONASE) 50 MCG/ACT nasal spray Use 2 spray(s) in each nostril once daily 16 g 0   gabapentin (NEURONTIN) 100 MG capsule TAKE 1 CAPSULE BY MOUTH IN THE MORNING AND 2 CAPSULES IN THE EVENING 270 capsule 1   glimepiride (AMARYL) 1 MG tablet Take 1 tablet by mouth twice daily 180 tablet 0   hydrochlorothiazide (HYDRODIURIL) 25 MG tablet TAKE 1 TABLET BY MOUTH ONCE DAILY FOR HIGH BLOOD PRESSURE 90 tablet 0   levocetirizine (XYZAL) 5 MG tablet TAKE 1 TABLET BY MOUTH ONCE DAILY IN THE EVENING 90 tablet 0   lisinopril (ZESTRIL) 2.5 MG tablet Take 1 tablet by mouth once daily 90 tablet 0   metFORMIN (GLUCOPHAGE) 1000 MG tablet TAKE 1 TABLET BY MOUTH TWICE DAILY WITH MEALS 180 tablet 0   Multiple Vitamin (MULTIVITAMIN WITH MINERALS) TABS tablet Take 1 tablet by mouth daily.     Omega-3 Fatty Acids (FISH OIL) 1000 MG CAPS Take 1,000 mg by mouth daily.     vitamin B-12 (CYANOCOBALAMIN) 1000 MCG tablet  Take 1,000 mcg by mouth daily.     sildenafil (REVATIO) 20 MG tablet Take 1 tablet (20 mg total) by mouth daily as needed. 30 tablet 1   No current facility-administered medications for this visit.    Allergies: Patient has no known allergies.  Past Medical History:  Diagnosis Date   Blood transfusion without reported diagnosis    with nasal sinus surgery   Hyperlipidemia    Hypertension     Past Surgical History:  Procedure Laterality Date   ABDOMINAL SURGERY     scrapnel removed    COLONOSCOPY  03/2005   Alphonsa Overall, polyp   EYE SURGERY     x 7 removed metal from eyes - shrapnel   HEMORROIDECTOMY     left shoulder surgery     NASAL SINUS SURGERY     right shoulder surgery      Family History  Problem Relation Age of Onset   Colon cancer Neg Hx    Colon polyps Neg Hx    Esophageal cancer Neg Hx    Rectal cancer Neg Hx    Stomach cancer Neg Hx    Diabetes Sister    Heart disease Brother     Social History   Tobacco Use   Smoking status: Former    Packs/day: 5.00    Years: 4.00    Pack years: 20.00  Types: Cigarettes    Quit date: 08/17/1986    Years since quitting: 34.5   Smokeless tobacco: Never  Substance Use Topics   Alcohol use: Yes    Alcohol/week: 10.0 standard drinks    Types: 10 Glasses of wine per week    Subjective:  Wanted to touch base about some health changes-  1) Will be having cataract surgery later this year ( right eye) 2) Will be having abnormal/ benign mole removed from right upper shoulder;   Would like to see if there are any ways to consolidate medications; just concerned that he is taking too much medication; Denies any chest pain, shortness of breath, blurred vision or headach   Objective:  Vitals:   03/07/21 0850  BP: 130/70  Pulse: 72  Temp: 98 F (36.7 C)  TempSrc: Oral  SpO2: 96%  Weight: 275 lb (124.7 kg)  Height: 6' 1"  (1.854 m)    General: Well developed, well nourished, in no acute distress  Skin : Warm and  dry.  Head: Normocephalic and atraumatic  Eyes: Sclera and conjunctiva clear; pupils round and reactive to light; extraocular movements intact  Ears: External normal; canals clear; tympanic membranes normal  Oropharynx: Pink, supple. No suspicious lesions  Neck: Supple without thyromegaly, adenopathy  Lungs: Respirations unlabored; clear to auscultation bilaterally without wheeze, rales, rhonchi  CVS exam: normal rate and regular rhythm.  Neurologic: Alert and oriented; speech intact; face symmetrical; moves all extremities well; CNII-XII intact without focal deficit   Assessment:  1. Type 2 diabetes mellitus without complication, without long-term current use of insulin ([place])   2. HYPERTENSION, BENIGN ESSENTIAL   3. Allergic rhinitis, unspecified seasonality, unspecified trigger   4. Erectile dysfunction, unspecified erectile dysfunction type     Plan:  Update labs today; Will try to taper of Coreg and manage blood pressure with combination of Lisinopril and HCTZ; discussed that Lisinopril will help with diabetes as well; He will call back in 2 weeks with response/ blood pressure readings;  Try combining Astelin and Flonase into Dymista; doing better since starting regular saline rinses; Sildenafil updated;   This visit occurred during the SARS-CoV-2 public health emergency.  Safety protocols were in place, including screening questions prior to the visit, additional usage of staff PPE, and extensive cleaning of exam room while observing appropriate contact time as indicated for disinfecting solutions.      No follow-ups on file.  Orders Placed This Encounter  Procedures   Comp Met (CMET)   Hemoglobin A1c    Requested Prescriptions   Signed Prescriptions Disp Refills   Azelastine-Fluticasone 137-50 MCG/ACT SUSP 23 g 3    Sig: Place 1 spray into the nose every 12 (twelve) hours.   sildenafil (REVATIO) 20 MG tablet 30 tablet 1    Sig: Take 1 tablet (20 mg total) by mouth daily  as needed.

## 2021-03-07 NOTE — Patient Instructions (Addendum)
Taper of the Coreg ( Cut the tablets in 1/2):  Take 1/2 tablet ( 3.25 mg) twice a day x 3 days; then cut to one per day x 3 days; then okay to stop;   Increase the Lisinopril to 5 mg ( 2/ day);   Please let me hear from you in 1-2 weeks regarding these changes;

## 2021-03-11 ENCOUNTER — Telehealth: Payer: Self-pay | Admitting: *Deleted

## 2021-03-11 NOTE — Telephone Encounter (Signed)
[facility] fax over stating that azelastine-fluticasone 137-50 mcg/act suspension is not covered by patients ins.  They will cover fluticasone.

## 2021-03-12 NOTE — Telephone Encounter (Signed)
I have called pt back and relayed the message to pt from the provider. He is okay with the 2 different meds and got his wife scheduled for virtual visit since she is covid pos. She has an appt with Paz tomorrow at 11:20 am. Her sx started on Monday 03/10/21.  Pt is now exposed to it Fyi to provider.   Also he is asking for additional refills of the nasal sprays.

## 2021-03-13 ENCOUNTER — Other Ambulatory Visit: Payer: Self-pay | Admitting: Family

## 2021-03-13 MED ORDER — FLUTICASONE PROPIONATE 50 MCG/ACT NA SUSP: NASAL | 11 refills | Status: DC

## 2021-03-13 MED ORDER — AZELASTINE HCL 137 MCG/SPRAY NA SOLN: NASAL | 11 refills | Status: DC

## 2021-03-15 ENCOUNTER — Other Ambulatory Visit: Payer: Self-pay | Admitting: Family

## 2021-03-17 ENCOUNTER — Encounter: Payer: Self-pay | Admitting: Gastroenterology

## 2021-03-17 ENCOUNTER — Telehealth (INDEPENDENT_AMBULATORY_CARE_PROVIDER_SITE_OTHER): Payer: Medicare HMO | Admitting: Family

## 2021-03-17 DIAGNOSIS — U071 COVID-19: Secondary | ICD-10-CM

## 2021-03-17 MED ORDER — BENZONATATE 100 MG PO CAPS: 100.0000 mg | ORAL_CAPSULE | Freq: Three times a day (TID) | ORAL | 0 refills | Status: DC | PRN

## 2021-03-17 MED ORDER — NIRMATRELVIR/RITONAVIR (PAXLOVID)TABLET: ORAL_TABLET | ORAL | 0 refills | Status: DC

## 2021-03-17 NOTE — Assessment & Plan Note (Signed)
Will rx with paxlovid as below. Tessalon as needed for cough. Pt is advised not to take sildenafil while taking paxlovid due to a potential drug interaction. Advised of CDC guidelines for self isolation/ ending isolation.  Advised of safe practice guidelines. Symptom Tier reviewed.  Encouraged to monitor for any worsening symptoms; watch for increased shortness of breath, weakness, and signs of dehydration. Advised when to seek emergency care.  Instructed to rest and hydrate well.  Advised to leave the house during recommended isolation period, only if it is necessary to seek medical care. Pt verbalizes understanding.

## 2021-03-17 NOTE — Progress Notes (Signed)
Virtual telephone visit    Virtual Visit via Telephone Note   This visit type was conducted due to national recommendations for restrictions regarding the COVID-19 Pandemic (e.g. social distancing) in an effort to limit this patient's exposure and mitigate transmission in our community. Due to his co-morbid illnesses, this patient is at least at moderate risk for complications without adequate follow up. This format is felt to be most appropriate for this patient at this time. The patient did not have access to video technology or had technical difficulties with video requiring transitioning to audio format only (telephone). Physical exam was limited to content and character of the telephone converstion. CMA was able to get the patient set up on a telephone visit.  Video visit was attempted but pt had audio issue so we changed to telephone.   Patient location: Home. Patient and provider in visit Provider location: Office  I discussed the limitations of evaluation and management by telemedicine and the availability of in person appointments. The patient expressed understanding and agreed to proceed.   Visit Date: 03/17/2021  Today's healthcare provider: Nance Pear, NP     Subjective:    Patient ID: Brian [name], male    DOB: 1950/11/06, 70 y.o.   MRN: [id number]  Chief Complaint  Patient presents with   Covid Positive    Tested positive yesterday, wife is positive as well   Covid symptoms    Per patient symptoms started 03-14-21.  Current symptoms are sore throat, cough, fatigue and back pain    HPI  Friday developed sore throat, Saturday felt OK. Sunday noted increased sinus drainage, back soreness, coughing, denies fever.  He denies loss of taste/smell. Appetite is decreased.  Keeping up with fluids. Denies diarrhea/nausea. He has had 3 pfizer vaccines in all.     Past Medical History:  Diagnosis Date   Blood transfusion without reported diagnosis     with nasal sinus surgery   Hyperlipidemia    Hypertension     Past Surgical History:  Procedure Laterality Date   ABDOMINAL SURGERY     scrapnel removed    COLONOSCOPY  03/2005   Alphonsa Overall, polyp   EYE SURGERY     x 7 removed metal from eyes - shrapnel   HEMORROIDECTOMY     left shoulder surgery     NASAL SINUS SURGERY     right shoulder surgery      Family History  Problem Relation Age of Onset   Colon cancer Neg Hx    Colon polyps Neg Hx    Esophageal cancer Neg Hx    Rectal cancer Neg Hx    Stomach cancer Neg Hx    Diabetes Sister    Heart disease Brother     Social History   Socioeconomic History   Marital status: Married    Spouse name: Not on file   Number of children: Not on file   Years of education: Not on file   Highest education level: Not on file  Occupational History   Not on file  Tobacco Use   Smoking status: Former    Packs/day: 5.00    Years: 4.00    Pack years: 20.00    Types: Cigarettes    Quit date: 08/17/1986    Years since quitting: 34.6   Smokeless tobacco: Never  Substance and Sexual Activity   Alcohol use: Yes    Alcohol/week: 10.0 standard drinks    Types: 10 Glasses of wine per  week   Drug use: No   Sexual activity: Not on file  Other Topics Concern   Not on file  Social History Narrative   Not on file   Social Determinants of Health   Financial Resource Strain: Low Risk    Difficulty of Paying Living Expenses: Not hard at all  Food Insecurity: No Food Insecurity   Worried About Running Out of Food in the Last Year: Never true   [place] in the Last Year: Never true  Transportation Needs: No Transportation Needs   Lack of Transportation (Medical): No   Lack of Transportation (Non-Medical): No  Physical Activity: Sufficiently Active   Days of Exercise per Week: 5 days   Minutes of Exercise per Session: 30 min  Stress: No Stress Concern Present   Feeling of Stress : Not at all  Social Connections: Socially  Integrated   Frequency of Communication with Friends and Family: More than three times a week   Frequency of Social Gatherings with Friends and Family: More than three times a week   Attends Religious Services: More than 4 times per year   Active Member of [employer] or Organizations: Yes   Attends Music therapist: More than 4 times per year   Marital Status: Married  Human resources officer Violence: Not on file    Outpatient Medications Prior to Visit  Medication Sig Dispense Refill   allopurinol (ZYLOPRIM) 100 MG tablet Take 1 tablet by mouth once daily 90 tablet 1   aspirin 81 MG chewable tablet Chew 81 mg by mouth daily.     atorvastatin (LIPITOR) 20 MG tablet Take 1 tablet by mouth once daily 90 tablet 1   Azelastine HCl 137 MCG/SPRAY SOLN USE 2 SPRAY(S) IN EACH NOSTRIL TWICE DAILY AS DIRECTED 30 mL 11   Azelastine-Fluticasone 137-50 MCG/ACT SUSP Place 1 spray into the nose every 12 (twelve) hours. 23 g 3   carvedilol (COREG) 6.25 MG tablet TAKE 1 TABLET BY MOUTH TWICE DAILY WITH MEALS 180 tablet 0   fluticasone (FLONASE) 50 MCG/ACT nasal spray Use 2 spray(s) in each nostril once daily 16 g 11   gabapentin (NEURONTIN) 100 MG capsule TAKE 1 CAPSULE BY MOUTH IN THE MORNING AND 2 CAPSULES IN THE EVENING 270 capsule 1   glimepiride (AMARYL) 1 MG tablet Take 1 tablet by mouth twice daily 180 tablet 0   hydrochlorothiazide (HYDRODIURIL) 25 MG tablet TAKE 1 TABLET BY MOUTH ONCE DAILY FOR HIGH BLOOD PRESSURE 90 tablet 0   levocetirizine (XYZAL) 5 MG tablet TAKE 1 TABLET BY MOUTH ONCE DAILY IN THE EVENING 90 tablet 0   lisinopril (ZESTRIL) 2.5 MG tablet Take 1 tablet by mouth once daily 90 tablet 0   metFORMIN (GLUCOPHAGE) 1000 MG tablet TAKE 1 TABLET BY MOUTH TWICE DAILY WITH MEALS 180 tablet 0   Multiple Vitamin (MULTIVITAMIN WITH MINERALS) TABS tablet Take 1 tablet by mouth daily.     Omega-3 Fatty Acids (FISH OIL) 1000 MG CAPS Take 1,000 mg by mouth daily.     sildenafil (REVATIO) 20 MG  tablet Take 1 tablet (20 mg total) by mouth daily as needed. 30 tablet 1   vitamin B-12 (CYANOCOBALAMIN) 1000 MCG tablet Take 1,000 mcg by mouth daily.     No facility-administered medications prior to visit.    No Known Allergies  ROS     Objective:    Physical Exam  There were no vitals taken for this visit. Wt Readings from Last 3 Encounters:  03/07/21 275 lb (124.7 kg)  10/07/20 279 lb (126.6 kg)  07/01/20 279 lb 9.6 oz (126.8 kg)    Gen: Awake, alert, no acute distress Resp: Breathing is even and non-labored Psych: calm/pleasant demeanor Neuro: Alert and Oriented x 3, + facial symmetry, speech is clear.      Assessment & Plan:   Problem List Items Addressed This Visit       Unprioritized   COVID-19 - Primary    Will rx with paxlovid as below. Tessalon as needed for cough. Pt is advised not to take sildenafil while taking paxlovid due to a potential drug interaction. Advised of CDC guidelines for self isolation/ ending isolation.  Advised of safe practice guidelines. Symptom Tier reviewed.  Encouraged to monitor for any worsening symptoms; watch for increased shortness of breath, weakness, and signs of dehydration. Advised when to seek emergency care.  Instructed to rest and hydrate well.  Advised to leave the house during recommended isolation period, only if it is necessary to seek medical care. Pt verbalizes understanding.         Relevant Medications   nirmatrelvir/ritonavir EUA (PAXLOVID) TABS    I am having Bailen K. Saini "Lanny Hurst" start on benzonatate and nirmatrelvir/ritonavir EUA. I am also having him maintain his aspirin, Fish Oil, vitamin B-12, multivitamin with minerals, gabapentin, atorvastatin, allopurinol, lisinopril, glimepiride, hydrochlorothiazide, metFORMIN, carvedilol, levocetirizine, Azelastine-Fluticasone, sildenafil, fluticasone, and Azelastine HCl.  Meds ordered this encounter  Medications   benzonatate (TESSALON) 100 MG capsule    Sig:  Take 1 capsule (100 mg total) by mouth 3 (three) times daily as needed for cough.    Dispense:  20 capsule    Refill:  0    Order Specific Question:   Supervising Provider    Answer:   Penni Homans A [[id number]]   nirmatrelvir/ritonavir EUA (PAXLOVID) TABS    Sig: Patient GFR is 71 Take nirmatrelvir (150 mg) two tablets twice daily for 5 days and ritonavir (100 mg) one tablet twice daily for 5 days.    Dispense:  30 tablet    Refill:  0    Order Specific Question:   Supervising Provider    Answer:   Penni Homans A [[id number]]     I discussed the assessment and treatment plan with the patient. The patient was provided an opportunity to ask questions and all were answered. The patient agreed with the plan and demonstrated an understanding of the instructions.   The patient was advised to call back or seek an in-person evaluation if the symptoms worsen or if the condition fails to improve as anticipated.  I provided 11 minutes of non-face-to-face time during this encounter.   Nance Pear, NP [employer] [facility] [employer] [phone] (phone) [phone] (fax)  [place]

## 2021-04-05 ENCOUNTER — Other Ambulatory Visit: Payer: Self-pay | Admitting: Family

## 2021-04-14 ENCOUNTER — Other Ambulatory Visit: Payer: Self-pay | Admitting: Family

## 2021-04-17 DIAGNOSIS — C44519 Basal cell carcinoma of skin of other part of trunk: Secondary | ICD-10-CM | POA: Diagnosis not present

## 2021-04-17 DIAGNOSIS — L820 Inflamed seborrheic keratosis: Secondary | ICD-10-CM | POA: Diagnosis not present

## 2021-04-24 ENCOUNTER — Telehealth: Payer: Self-pay | Admitting: Family

## 2021-04-24 MED ORDER — LISINOPRIL 2.5 MG PO TABS: 2.5000 mg | ORAL_TABLET | Freq: Two times a day (BID) | ORAL | 0 refills | Status: DC

## 2021-04-24 NOTE — Telephone Encounter (Signed)
A health advocate called regarding medication adherence, she said she talked to the member and she goes over medication , she said the pt stated he takes lisinopril (ZESTRIL) 2.5 MG tablet 2x a day when the instructions state 1x and she wants to know if someone can call pt and go over the correct dosage of medication he is supposed to take.

## 2021-04-24 NOTE — Telephone Encounter (Signed)
I have called pt to get clarification on what he was requesting. Pt is needing med refill of the Lisinopril since he had to double up on the medication. Pt reports that he is doing well on them. Also pt asked for a 100 day supply vs the 90 since it will cost as much as the 30 day with his insurance.

## 2021-05-12 DIAGNOSIS — H2511 Age-related nuclear cataract, right eye: Secondary | ICD-10-CM | POA: Diagnosis not present

## 2021-05-12 LAB — HM DIABETES EYE EXAM

## 2021-05-13 DIAGNOSIS — H2512 Age-related nuclear cataract, left eye: Secondary | ICD-10-CM | POA: Diagnosis not present

## 2021-05-17 ENCOUNTER — Other Ambulatory Visit: Payer: Self-pay | Admitting: Family

## 2021-05-28 ENCOUNTER — Other Ambulatory Visit: Payer: Self-pay | Admitting: Family

## 2021-05-31 ENCOUNTER — Other Ambulatory Visit: Payer: Self-pay | Admitting: Family

## 2021-06-02 DIAGNOSIS — H2512 Age-related nuclear cataract, left eye: Secondary | ICD-10-CM | POA: Diagnosis not present

## 2021-06-11 ENCOUNTER — Other Ambulatory Visit: Payer: Self-pay | Admitting: Family

## 2021-07-05 ENCOUNTER — Other Ambulatory Visit: Payer: Self-pay | Admitting: Family

## 2021-08-08 ENCOUNTER — Other Ambulatory Visit: Payer: Self-pay | Admitting: Family

## 2021-08-22 ENCOUNTER — Other Ambulatory Visit: Payer: Self-pay | Admitting: Family

## 2021-10-04 ENCOUNTER — Other Ambulatory Visit: Payer: Self-pay | Admitting: Family

## 2021-10-10 ENCOUNTER — Encounter: Payer: Self-pay | Admitting: Family

## 2021-10-10 ENCOUNTER — Other Ambulatory Visit: Payer: Self-pay | Admitting: Family

## 2021-10-10 ENCOUNTER — Ambulatory Visit (INDEPENDENT_AMBULATORY_CARE_PROVIDER_SITE_OTHER): Payer: Medicare HMO | Admitting: Family

## 2021-10-10 VITALS — BP 124/78 | HR 91 | Temp 97.5°F | Ht 73.0 in | Wt 274.4 lb

## 2021-10-10 DIAGNOSIS — Z1159 Encounter for screening for other viral diseases: Secondary | ICD-10-CM | POA: Diagnosis not present

## 2021-10-10 DIAGNOSIS — Z86010 Personal history of colonic polyps: Secondary | ICD-10-CM

## 2021-10-10 DIAGNOSIS — E119 Type 2 diabetes mellitus without complications: Secondary | ICD-10-CM | POA: Diagnosis not present

## 2021-10-10 DIAGNOSIS — Z13220 Encounter for screening for lipoid disorders: Secondary | ICD-10-CM

## 2021-10-10 DIAGNOSIS — Z125 Encounter for screening for malignant neoplasm of prostate: Secondary | ICD-10-CM | POA: Diagnosis not present

## 2021-10-10 DIAGNOSIS — R109 Unspecified abdominal pain: Secondary | ICD-10-CM

## 2021-10-10 DIAGNOSIS — R899 Unspecified abnormal finding in specimens from other organs, systems and tissues: Secondary | ICD-10-CM

## 2021-10-10 DIAGNOSIS — Z1211 Encounter for screening for malignant neoplasm of colon: Secondary | ICD-10-CM

## 2021-10-10 DIAGNOSIS — Z0000 Encounter for general adult medical examination without abnormal findings: Secondary | ICD-10-CM

## 2021-10-10 LAB — COMPREHENSIVE METABOLIC PANEL
ALT: 37 U/L (ref 0–53)
AST: 42 U/L — ABNORMAL HIGH (ref 0–37)
Albumin: 4.3 g/dL (ref 3.5–5.2)
Alkaline Phosphatase: 114 U/L (ref 39–117)
BUN: 28 mg/dL — ABNORMAL HIGH (ref 6–23)
CO2: 31 mEq/L (ref 19–32)
Calcium: 9.5 mg/dL (ref 8.4–10.5)
Chloride: 95 mEq/L — ABNORMAL LOW (ref 96–112)
Creatinine, Ser: 1.62 mg/dL — ABNORMAL HIGH (ref 0.40–1.50)
GFR: 42.09 mL/min — ABNORMAL LOW (ref >60.00)
Glucose, Bld: 208 mg/dL — ABNORMAL HIGH (ref 70–99)
Potassium: 4.6 mEq/L (ref 3.5–5.1)
Sodium: 132 mEq/L — ABNORMAL LOW (ref 135–145)
Total Bilirubin: 1.8 mg/dL — ABNORMAL HIGH (ref 0.2–1.2)
Total Protein: 6.9 g/dL (ref 6.0–8.3)

## 2021-10-10 LAB — URINALYSIS
Bilirubin Urine: NEGATIVE
Hgb urine dipstick: NEGATIVE
Ketones, ur: NEGATIVE
Leukocytes,Ua: NEGATIVE
Nitrite: NEGATIVE
Specific Gravity, Urine: >=1.03 — AB (ref 1.000–1.030)
Urine Glucose: NEGATIVE
Urobilinogen, UA: 0.2 (ref 0.0–1.0)
pH: 5.5 (ref 5.0–8.0)

## 2021-10-10 LAB — CBC WITH DIFFERENTIAL/PLATELET
Basophils Absolute: 0 10*3/uL (ref 0.0–0.1)
Basophils Relative: 0.7 % (ref 0.0–3.0)
Eosinophils Absolute: 0.2 10*3/uL (ref 0.0–0.7)
Eosinophils Relative: 3 % (ref 0.0–5.0)
HCT: 41 % (ref 39.0–52.0)
Hemoglobin: 14.2 g/dL (ref 13.0–17.0)
Lymphocytes Relative: 13.3 % (ref 12.0–46.0)
Lymphs Abs: 0.8 10*3/uL (ref 0.7–4.0)
MCHC: 34.7 g/dL (ref 30.0–36.0)
MCV: 92.6 fl (ref 78.0–100.0)
Monocytes Absolute: 0.8 10*3/uL (ref 0.1–1.0)
Monocytes Relative: 12.6 % — ABNORMAL HIGH (ref 3.0–12.0)
Neutro Abs: 4.3 10*3/uL (ref 1.4–7.7)
Neutrophils Relative %: 70.4 % (ref 43.0–77.0)
Platelets: 279 10*3/uL (ref 150.0–400.0)
RBC: 4.43 Mil/uL (ref 4.22–5.81)
RDW: 12.3 % (ref 11.5–15.5)
WBC: 6.2 10*3/uL (ref 4.0–10.5)

## 2021-10-10 LAB — LIPID PANEL
Cholesterol: 104 mg/dL (ref 0–200)
HDL: 21.8 mg/dL — ABNORMAL LOW (ref >39.00)
NonHDL: 81.75
Total CHOL/HDL Ratio: 5
Triglycerides: 236 mg/dL — ABNORMAL HIGH (ref 0.0–149.0)
VLDL: 47.2 mg/dL — ABNORMAL HIGH (ref 0.0–40.0)

## 2021-10-10 LAB — HEMOGLOBIN A1C: Hgb A1c MFr Bld: 8.2 % — ABNORMAL HIGH (ref 4.6–6.5)

## 2021-10-10 LAB — PSA: PSA: 2.01 ng/mL (ref 0.10–4.00)

## 2021-10-10 LAB — LDL CHOLESTEROL, DIRECT: Direct LDL: 54 mg/dL

## 2021-10-10 LAB — TSH: TSH: 2.08 u[IU]/mL (ref 0.35–5.50)

## 2021-10-10 NOTE — Progress Notes (Signed)
Brian [name] is a 71 y.o. male with the following history as recorded in [employer]:  Patient Active Problem List   Diagnosis Date Noted   COVID-19 03/17/2021   Toe injury, left, initial encounter 06/12/2019   DYSPNEA ON EXERTION 07/27/2008   ELECTROCARDIOGRAM, ABNORMAL 07/27/2008   DIABETES-TYPE 2 05/07/2008   Hyperlipidemia 05/07/2008   ERECTILE DYSFUNCTION 05/07/2008   HYPERTENSION, BENIGN ESSENTIAL 05/07/2008    Current Outpatient Medications  Medication Sig Dispense Refill   allopurinol (ZYLOPRIM) 100 MG tablet Take 1 tablet by mouth once daily 90 tablet 0   aspirin 81 MG chewable tablet Chew 81 mg by mouth daily.     atorvastatin (LIPITOR) 20 MG tablet Take 1 tablet by mouth once daily 90 tablet 0   Azelastine-Fluticasone 137-50 MCG/ACT SUSP Place 1 spray into the nose every 12 (twelve) hours. 23 g 3   carvedilol (COREG) 6.25 MG tablet TAKE 1 TABLET BY MOUTH TWICE DAILY WITH MEALS 180 tablet 0   fluticasone (FLONASE) 50 MCG/ACT nasal spray Use 2 spray(s) in each nostril once daily 16 g 11   gabapentin (NEURONTIN) 100 MG capsule TAKE 1 CAPSULE BY MOUTH IN THE MORNING AND 2 IN THE EVENING 270 capsule 0   glimepiride (AMARYL) 1 MG tablet Take 1 tablet by mouth twice daily 180 tablet 0   hydrochlorothiazide (HYDRODIURIL) 25 MG tablet TAKE 1 TABLET BY MOUTH ONCE DAILY FOR HIGH BLOOD PRESSURE 90 tablet 0   levocetirizine (XYZAL) 5 MG tablet TAKE 1 TABLET BY MOUTH ONCE DAILY IN THE EVENING 90 tablet 1   lisinopril (ZESTRIL) 2.5 MG tablet Take 1 tablet by mouth twice daily 180 tablet 0   metFORMIN (GLUCOPHAGE) 1000 MG tablet TAKE 1 TABLET BY MOUTH TWICE DAILY WITH MEALS 180 tablet 0   Multiple Vitamin (MULTIVITAMIN WITH MINERALS) TABS tablet Take 1 tablet by mouth daily.     sildenafil (REVATIO) 20 MG tablet Take 1 tablet (20 mg total) by mouth daily as needed. 30 tablet 1   vitamin B-12 (CYANOCOBALAMIN) 1000 MCG tablet Take 1,000 mcg by mouth daily.     Omega-3 Fatty Acids (FISH  OIL) 1000 MG CAPS Take 1,000 mg by mouth daily. (Patient not taking: Reported on 10/10/2021)     No current facility-administered medications for this visit.    Allergies: Patient has no known allergies.  Past Medical History:  Diagnosis Date   Blood transfusion without reported diagnosis    with nasal sinus surgery   Hyperlipidemia    Hypertension     Past Surgical History:  Procedure Laterality Date   ABDOMINAL SURGERY     scrapnel removed    COLONOSCOPY  03/2005   Alphonsa Overall, polyp   EYE SURGERY     x 7 removed metal from eyes - shrapnel   HEMORROIDECTOMY     left shoulder surgery     NASAL SINUS SURGERY     right shoulder surgery      Family History  Problem Relation Age of Onset   Colon cancer Neg Hx    Colon polyps Neg Hx    Esophageal cancer Neg Hx    Rectal cancer Neg Hx    Stomach cancer Neg Hx    Diabetes Sister    Heart disease Brother     Social History   Tobacco Use   Smoking status: Former    Packs/day: 5.00    Years: 4.00    Pack years: 20.00    Types: Cigarettes    Quit date: 08/17/1986  Years since quitting: 35.1   Smokeless tobacco: Never  Substance Use Topics   Alcohol use: Yes    Alcohol/week: 10.0 standard drinks    Types: 10 Glasses of wine per week    Subjective:  Presents for yearly CPE; overdue to schedule colonoscopy- was due in 03/2021- agree to schedule;  Sees dermatology regularly as well as eye doctor;  Notes that has 2 falls in the past month- "just feel like my balance is top heavy." Feel like my recovery is not as quick; does not feel like neuropathy;   Health Maintenance  Topic Date Due   Hepatitis C Screening  Never done   COVID-19 Vaccine (4 - Booster for Pfizer series) 07/16/2020   COLONOSCOPY (Pts 45-81yr Insurance coverage will need to be confirmed)  12/05/2020   HEMOGLOBIN A1C  09/07/2021   INFLUENZA VACCINE  11/14/2021 (Originally 03/17/2021)   OPHTHALMOLOGY EXAM  05/13/2022   FOOT EXAM  10/10/2022   TETANUS/TDAP   10/16/2027   Pneumonia Vaccine 71 Years old  Completed   Zoster Vaccines- Shingrix  Completed   HPV VACCINES  Aged Out       Objective:  Vitals:   10/10/21 0839  BP: 124/78  Pulse: 91  Temp: (!) 97.5 F (36.4 C)  TempSrc: Oral  SpO2: 95%  Weight: 274 lb 6.4 oz (124.5 kg)  Height: 6' 1"  (1.854 m)    General: Well developed, well nourished, in no acute distress  Skin : Warm and dry.  Head: Normocephalic and atraumatic  Eyes: Sclera and conjunctiva clear; pupils round and reactive to light; extraocular movements intact  Ears: External normal; canals clear; tympanic membranes normal  Oropharynx: Pink, supple. No suspicious lesions  Neck: Supple without thyromegaly, adenopathy  Lungs: Respirations unlabored; clear to auscultation bilaterally without wheeze, rales, rhonchi  CVS exam: normal rate and regular rhythm.  Abdomen: Soft; nontender; nondistended; normoactive bowel sounds; no masses or hepatosplenomegaly  Musculoskeletal: No deformities; no active joint inflammation  Extremities: No edema, cyanosis, clubbing  Vessels: Symmetric bilaterally  Neurologic: Alert and oriented; speech intact; face symmetrical; moves all extremities well; CNII-XII intact without focal deficit  Assessment:  1. PE (physical exam), annual   2. Lipid screening   3. Type 2 diabetes mellitus without complication, without long-term current use of insulin ([place]   4. Prostate cancer screening   5. Need for hepatitis C screening test   6. Encounter for colonoscopy due to history of adenomatous colonic polyps   7. Flank pain     Plan:  Age appropriate preventive healthcare needs addressed; encouraged regular eye doctor and dental exams; encouraged regular exercise; will update labs and refills as needed today; follow-up to be determined; Discussed PT and/or neurology due to concerns for falls/ balance issues- he notes he wants to think about it at this time- does not want to schedule any appointment at  this time but does agree to start working with a trainer again for initial evaluation;   This visit occurred during the SARS-CoV-2 public health emergency.  Safety protocols were in place, including screening questions prior to the visit, additional usage of staff PPE, and extensive cleaning of exam room while observing appropriate contact time as indicated for disinfecting solutions.    No follow-ups on file.  Orders Placed This Encounter  Procedures   CBC with Differential/Platelet   Comp Met (CMET)   Lipid panel   TSH   Hemoglobin A1c   PSA   Hepatitis C Antibody   Urinalysis  Ambulatory referral to Gastroenterology    Referral Priority:   Routine    Referral Type:   Consultation    Referral Reason:   Specialty Services Required    Referred to Provider:   Doran Stabler, MD    Number of Visits Requested:   1    Requested Prescriptions    No prescriptions requested or ordered in this encounter

## 2021-10-13 ENCOUNTER — Other Ambulatory Visit: Payer: Self-pay

## 2021-10-13 LAB — HEPATITIS C ANTIBODY
Hepatitis C Ab: NONREACTIVE
SIGNAL TO CUT-OFF: <0.02 (ref <1.00)

## 2021-10-15 ENCOUNTER — Encounter: Payer: Self-pay | Admitting: Gastroenterology

## 2021-10-17 ENCOUNTER — Other Ambulatory Visit (INDEPENDENT_AMBULATORY_CARE_PROVIDER_SITE_OTHER): Payer: Medicare HMO

## 2021-10-17 DIAGNOSIS — R899 Unspecified abnormal finding in specimens from other organs, systems and tissues: Secondary | ICD-10-CM

## 2021-10-17 LAB — COMPREHENSIVE METABOLIC PANEL
ALT: 35 U/L (ref 0–53)
AST: 33 U/L (ref 0–37)
Albumin: 4.4 g/dL (ref 3.5–5.2)
Alkaline Phosphatase: 100 U/L (ref 39–117)
BUN: 19 mg/dL (ref 6–23)
CO2: 30 mEq/L (ref 19–32)
Calcium: 9.6 mg/dL (ref 8.4–10.5)
Chloride: 97 mEq/L (ref 96–112)
Creatinine, Ser: 1.05 mg/dL (ref 0.40–1.50)
GFR: 70.82 mL/min (ref >60.00)
Glucose, Bld: 188 mg/dL — ABNORMAL HIGH (ref 70–99)
Potassium: 4.8 mEq/L (ref 3.5–5.1)
Sodium: 134 mEq/L — ABNORMAL LOW (ref 135–145)
Total Bilirubin: 1.6 mg/dL — ABNORMAL HIGH (ref 0.2–1.2)
Total Protein: 6.9 g/dL (ref 6.0–8.3)

## 2021-10-20 ENCOUNTER — Other Ambulatory Visit: Payer: Self-pay | Admitting: Family

## 2021-10-20 DIAGNOSIS — R7989 Other specified abnormal findings of blood chemistry: Secondary | ICD-10-CM

## 2021-10-21 ENCOUNTER — Ambulatory Visit
Admission: RE | Admit: 2021-10-21 | Discharge: 2021-10-21 | Disposition: A | Discharge status: Home / Self Care | Payer: Medicare HMO | Source: Ambulatory Visit | Attending: Family | Admitting: Family

## 2021-10-21 DIAGNOSIS — R7989 Other specified abnormal findings of blood chemistry: Secondary | ICD-10-CM | POA: Diagnosis not present

## 2021-10-23 ENCOUNTER — Other Ambulatory Visit: Payer: Self-pay | Admitting: Family

## 2021-11-12 ENCOUNTER — Ambulatory Visit (AMBULATORY_SURGERY_CENTER): Payer: Medicare HMO | Admitting: *Deleted

## 2021-11-12 ENCOUNTER — Other Ambulatory Visit: Payer: Self-pay

## 2021-11-12 VITALS — Ht 73.0 in | Wt 260.0 lb

## 2021-11-12 DIAGNOSIS — Z86010 Personal history of colonic polyps: Secondary | ICD-10-CM

## 2021-11-12 MED ORDER — PEG 3350-KCL-NA BICARB-NACL 420 G PO SOLR: 4000.0000 mL | Freq: Once | ORAL | 0 refills | Status: AC

## 2021-11-12 NOTE — Progress Notes (Signed)

## 2021-11-18 ENCOUNTER — Encounter: Payer: Self-pay | Admitting: Gastroenterology

## 2021-11-21 ENCOUNTER — Other Ambulatory Visit: Payer: Self-pay | Admitting: Family

## 2021-11-26 ENCOUNTER — Ambulatory Visit (AMBULATORY_SURGERY_CENTER): Payer: Medicare HMO | Admitting: Gastroenterology

## 2021-11-26 ENCOUNTER — Encounter: Payer: Self-pay | Admitting: Gastroenterology

## 2021-11-26 VITALS — BP 136/85 | HR 76 | Temp 97.5°F | Resp 14 | Ht 73.0 in | Wt 260.0 lb

## 2021-11-26 DIAGNOSIS — Z86010 Personal history of colonic polyps: Secondary | ICD-10-CM

## 2021-11-26 DIAGNOSIS — D122 Benign neoplasm of ascending colon: Secondary | ICD-10-CM

## 2021-11-26 DIAGNOSIS — I10 Essential (primary) hypertension: Secondary | ICD-10-CM | POA: Diagnosis not present

## 2021-11-26 DIAGNOSIS — E785 Hyperlipidemia, unspecified: Secondary | ICD-10-CM | POA: Diagnosis not present

## 2021-11-26 MED ORDER — SODIUM CHLORIDE 0.9 % IV SOLN: 500.0000 mL | Freq: Once | INTRAVENOUS | Status: DC

## 2021-11-26 NOTE — Progress Notes (Signed)
Called to room to assist during endoscopic procedure.  Patient ID and intended procedure confirmed with present staff. Received instructions for my participation in the procedure from the performing physician.  

## 2021-11-26 NOTE — Patient Instructions (Addendum)
Handouts provided on polyps, diverticulosis and hemorrhoids.   YOU HAD AN ENDOSCOPIC PROCEDURE TODAY AT THE [facility]:   Refer to the procedure report that was given to you for any specific questions about what was found during the examination.  If the procedure report does not answer your questions, please call your gastroenterologist to clarify.  If you requested that your care partner not be given the details of your procedure findings, then the procedure report has been included in a sealed envelope for you to review at your convenience later.  YOU SHOULD EXPECT: Some feelings of bloating in the abdomen. Passage of more gas than usual.  Walking can help get rid of the air that was put into your GI tract during the procedure and reduce the bloating. If you had a lower endoscopy (such as a colonoscopy or flexible sigmoidoscopy) you may notice spotting of blood in your stool or on the toilet paper. If you underwent a bowel prep for your procedure, you may not have a normal bowel movement for a few days.  Please Note:  You might notice some irritation and congestion in your nose or some drainage.  This is from the oxygen used during your procedure.  There is no need for concern and it should clear up in a day or so.  SYMPTOMS TO REPORT IMMEDIATELY:   Following lower endoscopy (colonoscopy or flexible sigmoidoscopy):  Excessive amounts of blood in the stool  Significant tenderness or worsening of abdominal pains  Swelling of the abdomen that is new, acute  Fever of 100F or higher   For urgent or emergent issues, a gastroenterologist can be reached at any hour by calling [phone]. Do not use MyChart messaging for urgent concerns.    DIET:  We do recommend a small meal at first, but then you may proceed to your regular diet.  Drink plenty of fluids but you should avoid alcoholic beverages for 24 hours.  ACTIVITY:  You should plan to take it easy for the rest of today and  you should NOT DRIVE or use heavy machinery until tomorrow (because of the sedation medicines used during the test).    FOLLOW UP: Our staff will call the number listed on your records 48-72 hours following your procedure to check on you and address any questions or concerns that you may have regarding the information given to you following your procedure. If we do not reach you, we will leave a message.  We will attempt to reach you two times.  During this call, we will ask if you have developed any symptoms of COVID 19. If you develop any symptoms (ie: fever, flu-like symptoms, shortness of breath, cough etc.) before then, please call [phone].  If you test positive for Covid 19 in the 2 weeks post procedure, please call and report this information to us.    If any biopsies were taken you will be contacted by phone or by letter within the next 1-3 weeks.  Please call us at [phone] if you have not heard about the biopsies in 3 weeks.    SIGNATURES/CONFIDENTIALITY: You and/or your care partner have signed paperwork which will be entered into your electronic medical record.  These signatures attest to the fact that that the information above on your After Visit Summary has been reviewed and is understood.  Full responsibility of the confidentiality of this discharge information lies with you and/or your care-partner.  

## 2021-11-26 NOTE — Progress Notes (Signed)
Sedate, gd SR, tolerated procedure well, VSS, report to RN 

## 2021-11-26 NOTE — Progress Notes (Signed)
Pt's states no medical or surgical changes since previsit or office visit. 

## 2021-11-26 NOTE — Op Note (Signed)
[facility] ?Patient Name: Brian [name] ?Procedure Date: 11/26/2021 7:58 AM ?MRN: [id number] ?Endoscopist: Estill Cotta. Loletha Carrow , MD ?Age: 71 ?Referring MD:  ?Date of Birth: 1950/12/05 ?Gender: Male ?Account #: [id number] ?Procedure:                Colonoscopy ?Indications:              Surveillance: Personal history of adenomatous  ?                          polyps on last colonoscopy > 5 years ago ?                          TA < 1m April 2017 ?Medicines:                Monitored Anesthesia Care ?Procedure:                Pre-Anesthesia Assessment: ?                          - Prior to the procedure, a History and Physical  ?                          was performed, and patient medications and  ?                          allergies were reviewed. The patient's tolerance of  ?                          previous anesthesia was also reviewed. The risks  ?                          and benefits of the procedure and the sedation  ?                          options and risks were discussed with the patient.  ?                          All questions were answered, and informed consent  ?                          was obtained. Prior Anticoagulants: The patient has  ?                          taken no previous anticoagulant or antiplatelet  ?                          agents. ASA Grade Assessment: II - A patient with  ?                          mild systemic disease. After reviewing the risks  ?                          and benefits, the patient was deemed in  ?  satisfactory condition to undergo the procedure. ?                          After obtaining informed consent, the colonoscope  ?                          was passed under direct vision. Throughout the  ?                          procedure, the patient's blood pressure, pulse, and  ?                          oxygen saturations were monitored continuously. The  ?                          CF [id number] #[id number] was introduced through the anus  ?                           and advanced to the the cecum, identified by  ?                          appendiceal orifice and ileocecal valve. The  ?                          colonoscopy was performed without difficulty. The  ?                          patient tolerated the procedure well. The quality  ?                          of the bowel preparation was good. The ileocecal  ?                          valve, appendiceal orifice, and rectum were  ?                          photographed. The bowel preparation used was  ?                          GoLYTELY. ?Scope In: 8:08:09 AM ?Scope Out: 8:26:22 AM ?Scope Withdrawal Time: 0 hours 14 minutes 2 seconds  ?Total Procedure Duration: 0 hours 18 minutes 13 seconds  ?Findings:                 The perianal and digital rectal examinations were  ?                          normal. ?                          Multiple diverticula were found in the left colon  ?                          and right colon. ?  A 4 mm polyp was found in the proximal ascending  ?                          colon. The polyp was sessile. The polyp was removed  ?                          with a cold snare. Resection and retrieval were  ?                          complete. ?                          Internal hemorrhoids were found (within the anal  ?                          canal). There was also evidence of a surgical  ?                          hemorrhoidectomy at the anal verge. ?                          The exam was otherwise without abnormality on  ?                          direct and retroflexion views. ?Complications:            No immediate complications. ?Estimated Blood Loss:     Estimated blood loss: none. Estimated blood loss  ?                          was minimal. ?Impression:               - Diverticulosis in the left colon and in the right  ?                          colon. ?                          - One 4 mm polyp in the proximal ascending colon,  ?                           removed with a cold snare. Resected and retrieved. ?                          - Internal hemorrhoids. ?                          - The examination was otherwise normal on direct  ?                          and retroflexion views. ?Recommendation:           - Patient has a contact number available for  ?                          emergencies. The signs and symptoms  of potential  ?                          delayed complications were discussed with the  ?                          patient. Return to normal activities tomorrow.  ?                          Written discharge instructions were provided to the  ?                          patient. ?                          - Resume previous diet. ?                          - Continue present medications. ?                          - Await pathology results. ?                          - No repeat routine colonoscopy recommended due to  ?                          age, current guidelines and low risk polyp history. ?Cottrell Gentles L. Loletha Carrow, MD ?11/26/2021 8:38:55 AM ?This report has been signed electronically. ?

## 2021-11-26 NOTE — Progress Notes (Signed)
History and Physical: ? This patient presents for endoscopic testing for: ?Encounter Diagnosis  ?Name Primary?  ? Personal history of colonic polyps Yes  ? ? ?Patient denies chronic abdominal pain, rectal bleeding, constipation or diarrhea. ?Last colonoscopy with TA 11/2015 ? ?ROS: ?Patient denies chest pain or shortness of breath ? ? ?Past Medical History: ?Past Medical History:  ?Diagnosis Date  ? Allergy   ? "pollen"  ? Blood transfusion without reported diagnosis   ? with nasal sinus surgery  ? Cataract   ? BILATERAL,REMOVED  ? Diabetes mellitus without complication ([place])   ? Fatty liver   ? "SOME EVIDENCE ON IMAGING EXAM"  ? Hyperlipidemia   ? Hypertension   ? ? ? ?Past Surgical History: ?Past Surgical History:  ?Procedure Laterality Date  ? ABDOMINAL SURGERY    ? scrapnel removed   ? CATARACTS Bilateral   ? COLONOSCOPY  03/17/2005  ? Alphonsa Overall, polyp  ? EYE SURGERY    ? x 7 removed metal from eyes - shrapnel  ? HEMORROIDECTOMY    ? left shoulder surgery    ? NASAL SINUS SURGERY    ? POLYPECTOMY    ? right shoulder surgery    ? ? ?Allergies: ?No Known Allergies ? ?Outpatient Meds: ?Current Outpatient Medications  ?Medication Sig Dispense Refill  ? allopurinol (ZYLOPRIM) 100 MG tablet Take 1 tablet by mouth once daily 90 tablet 0  ? aspirin EC 81 MG tablet Take 81 mg by mouth daily. Swallow whole.    ? Azelastine-Fluticasone 137-50 MCG/ACT SUSP Place 1 spray into the nose every 12 (twelve) hours. 23 g 3  ? fluticasone (FLONASE) 50 MCG/ACT nasal spray Use 2 spray(s) in each nostril once daily 16 g 11  ? gabapentin (NEURONTIN) 100 MG capsule TAKE 1 CAPSULE BY MOUTH IN THE MORNING AND 2 CAPSULES IN THE EVENING 270 capsule 0  ? glimepiride (AMARYL) 1 MG tablet Take 1 tablet by mouth twice daily 180 tablet 0  ? hydrochlorothiazide (HYDRODIURIL) 25 MG tablet TAKE 1 TABLET BY MOUTH ONCE DAILY FOR HIGH BLOOD PRESSURE 90 tablet 0  ? levocetirizine (XYZAL) 5 MG tablet TAKE 1 TABLET BY MOUTH ONCE DAILY IN THE EVENING 90  tablet 0  ? lisinopril (ZESTRIL) 2.5 MG tablet Take 1 tablet by mouth twice daily 180 tablet 0  ? metFORMIN (GLUCOPHAGE) 1000 MG tablet TAKE 1 TABLET BY MOUTH TWICE DAILY WITH MEALS 180 tablet 0  ? Multiple Vitamin (MULTIVITAMIN WITH MINERALS) TABS tablet Take 1 tablet by mouth daily.    ? OPTIVE 0.5-0.9 % ophthalmic solution 1 drop daily.    ? OVER THE COUNTER MEDICATION CLEAR EYES FOR REDNESS AS NEEDED    ? vitamin B-12 (CYANOCOBALAMIN) 1000 MCG tablet Take 1,000 mcg by mouth daily.    ? atorvastatin (LIPITOR) 20 MG tablet Take 1 tablet by mouth once daily 90 tablet 0  ? sildenafil (REVATIO) 20 MG tablet Take 1 tablet (20 mg total) by mouth daily as needed. (Patient not taking: Reported on 11/12/2021) 30 tablet 1  ? ?Current Facility-Administered Medications  ?Medication Dose Route Frequency Provider Last Rate Last Admin  ? 0.9 %  sodium chloride infusion  500 mL Intravenous Once Doran Stabler, MD      ? ? ? ? ?___________________________________________________________________ ?Objective  ? ?Exam: ? ?BP 132/79   Pulse 87   Temp (!) 97.5 ?F (36.4 ?C)   Ht '6\' 1"'$  (1.854 m)   Wt 260 lb (117.9 kg)   SpO2 95%  BMI 34.30 kg/m?  ? ?CV: RRR without murmur, S1/S2 ?Resp: clear to auscultation bilaterally, normal RR and effort noted ?GI: soft, no tenderness, with active bowel sounds. ? ? ?Assessment: ?Encounter Diagnosis  ?Name Primary?  ? Personal history of colonic polyps Yes  ? ? ? ?Plan: ?Colonoscopy ? The benefits and risks of the planned procedure were described in detail with the patient or (when appropriate) their health care proxy.  Risks were outlined as including, but not limited to, bleeding, infection, perforation, adverse medication reaction leading to cardiac or pulmonary decompensation, pancreatitis (if ERCP).  The limitation of incomplete mucosal visualization was also discussed.  No guarantees or warranties were given. ? ? ? ?The patient is appropriate for an endoscopic procedure in the  ambulatory setting. ? ? - Wilfrid Lund, MD ? ? ? ? ?

## 2021-11-28 ENCOUNTER — Encounter: Payer: Self-pay | Admitting: Gastroenterology

## 2021-11-28 ENCOUNTER — Telehealth: Payer: Self-pay

## 2021-11-28 NOTE — Telephone Encounter (Signed)
?  Follow up Call- ? ? ?  11/26/2021  ?  7:46 AM  ?Call back number  ?Post procedure Call Back phone  # [phone]  ?Permission to leave phone message Yes  ?  ? ?Patient questions: ? ?Do you have a fever, pain , or abdominal swelling? No. ?Pain Score  0 * ? ?Have you tolerated food without any problems? Yes.   ? ?Have you been able to return to your normal activities? Yes.   ? ?Do you have any questions about your discharge instructions: ?Diet   No. ?Medications  No. ?Follow up visit  No. ? ?Do you have questions or concerns about your Care? No. ? ?Actions: ?* If pain score is 4 or above: ?No action needed, pain <4. ? ? ?

## 2021-12-01 ENCOUNTER — Telehealth: Payer: Self-pay

## 2021-12-01 ENCOUNTER — Telehealth: Payer: Self-pay | Admitting: Family

## 2021-12-01 ENCOUNTER — Other Ambulatory Visit: Payer: Self-pay | Admitting: Family

## 2021-12-01 MED ORDER — GLIMEPIRIDE 2 MG PO TABS: 2.0000 mg | ORAL_TABLET | Freq: Two times a day (BID) | ORAL | 0 refills | Status: DC

## 2021-12-01 NOTE — Telephone Encounter (Signed)
Patient notified and he has already picked up. ?

## 2021-12-01 NOTE — Telephone Encounter (Signed)
Pt called stating that he was unable to get a refill on his glimepiride because his pharmacy told him it wasn't time for a refill yet. After reviewing his chart, found that it was sent to the same pharmacy on 4.10.2023. Advised pt of this and pt stated that the pharmacy hadn't received it. Pt also stated that Mickel Baas had upped the dosage to 2 tabs twice daily and that info had not been updated in the last instance of refill on 4.10.2023. Pt stated he is currently out of the medication. Please advise. ? ?Medication:  ? ?glimepiride (AMARYL) 1 MG tablet [[id number]]  ? ?Has the patient contacted their pharmacy? Yes.   ?(If no, request that the patient contact the pharmacy for the refill.) ?(If yes, when and what did the pharmacy advise?)  ? ?Contact PCP ? ?Preferred Pharmacy (with phone number or street name):  ? ?[facility] [address] [place], [place] [address]  ?Phone:  [phone]  Fax:  [phone] ? ?Agent: Please be advised that RX refills may take up to 3 business days. We ask that you follow-up with your pharmacy.  ?

## 2021-12-01 NOTE — Telephone Encounter (Signed)
Initial Comment Caller states they have a prescription they've taken ?for a while. They took one in the morning and one ?at night. During last visit on february 22nd, it was ?changed to 2 in the morning and 2 at night. Caller ?has run out and pharmacy will not refill. ?Translation No ?Nurse Assessment ?Nurse: Hardin Negus, RN, Mardene Celeste Date/Time Eilene Ghazi Time): 11/29/2021 1:56:08 PM ?Please select the assessment type ---Refill ?Does the patient have enough medication to last until ?the office opens? ---No ?Disp. Time (Eastern ?Time) Disposition Final User ?11/29/2021 1:23:04 PM Send To Nurse Krista Blue, RN, Barnetta Chapel ?11/29/2021 1:58:38 PM Clinical Call Yes Hardin Negus, RN, Mardene Celeste ?Caller Understands Yes ?Comments ?User: Ledora Bottcher, RN Date/Time Eilene Ghazi Time): 11/29/2021 1:58:02 PM ?He states the pharmacy was not aware the dose had been doubled. ?Referrals ?REFERRED TO PCP OFFICE ?

## 2021-12-02 NOTE — Progress Notes (Signed)
I called pt to gather more information. Pt stated that GI doctor was pleased with results and no additional work up needed at this time. PCP notified.  ?

## 2022-01-19 ENCOUNTER — Other Ambulatory Visit: Payer: Self-pay | Admitting: Family

## 2022-01-29 ENCOUNTER — Other Ambulatory Visit: Payer: Self-pay | Admitting: Family

## 2022-02-02 ENCOUNTER — Other Ambulatory Visit: Payer: Self-pay | Admitting: Family

## 2022-02-04 ENCOUNTER — Telehealth: Payer: Self-pay

## 2022-02-04 NOTE — Telephone Encounter (Signed)
error 

## 2022-02-14 ENCOUNTER — Other Ambulatory Visit: Payer: Self-pay | Admitting: Family

## 2022-03-02 DIAGNOSIS — L304 Erythema intertrigo: Secondary | ICD-10-CM | POA: Diagnosis not present

## 2022-03-02 DIAGNOSIS — L570 Actinic keratosis: Secondary | ICD-10-CM | POA: Diagnosis not present

## 2022-03-02 DIAGNOSIS — L814 Other melanin hyperpigmentation: Secondary | ICD-10-CM | POA: Diagnosis not present

## 2022-03-02 DIAGNOSIS — L821 Other seborrheic keratosis: Secondary | ICD-10-CM | POA: Diagnosis not present

## 2022-03-02 DIAGNOSIS — L988 Other specified disorders of the skin and subcutaneous tissue: Secondary | ICD-10-CM | POA: Diagnosis not present

## 2022-03-02 DIAGNOSIS — L578 Other skin changes due to chronic exposure to nonionizing radiation: Secondary | ICD-10-CM | POA: Diagnosis not present

## 2022-03-02 DIAGNOSIS — D225 Melanocytic nevi of trunk: Secondary | ICD-10-CM | POA: Diagnosis not present

## 2022-03-02 DIAGNOSIS — D485 Neoplasm of uncertain behavior of skin: Secondary | ICD-10-CM | POA: Diagnosis not present

## 2022-03-02 NOTE — Progress Notes (Unsigned)
Subjective:   Amel Delyle Weider is a 71 y.o. male who presents for Medicare Annual/Subsequent preventive examination.  Review of Systems     Cardiac Risk Factors include: advanced age (>57mn, >>88women);obesity (BMI >30kg/m2);diabetes mellitus;hypertension;male gender;dyslipidemia     Objective:    There were no vitals filed for this visit. There is no height or weight on file to calculate BMI.     03/03/2022    9:52 AM 10/10/2021    8:44 AM 07/02/2020    2:52 PM 12/06/2015   10:15 AM 11/22/2015    1:46 PM  Advanced Directives  Does Patient Have a Medical Advance Directive? Yes Yes Yes Yes Yes  Type of AParamed[place]of [place] will [place] will Living will;Healthcare Power of Attorney Living will Living will;Healthcare Power of Attorney  Does patient want to make changes to medical advance directive?  No - Patient declined No - Patient declined    [place] of [place]? No - copy requested No - copy requested No - copy requested No - copy requested     Current Medications (verified) Outpatient Encounter Medications as of 03/03/2022  Medication Sig   allopurinol (ZYLOPRIM) 100 MG tablet Take 1 tablet by mouth once daily   aspirin EC 81 MG tablet Take 81 mg by mouth daily. Swallow whole.   atorvastatin (LIPITOR) 20 MG tablet Take 1 tablet by mouth once daily   Azelastine-Fluticasone 137-50 MCG/ACT SUSP Place 1 spray into the nose every 12 (twelve) hours.   fluticasone (FLONASE) 50 MCG/ACT nasal spray Use 2 spray(s) in each nostril once daily   gabapentin (NEURONTIN) 100 MG capsule TAKE 1 CAPSULE BY MOUTH IN THE MORNING AND 2 IN THE EVENING   glimepiride (AMARYL) 2 MG tablet Take 1 tablet by mouth twice daily   hydrochlorothiazide (HYDRODIURIL) 25 MG tablet TAKE 1 TABLET BY MOUTH ONCE DAILY FOR HIGH BLOOD PRESSURE   levocetirizine (XYZAL) 5 MG tablet TAKE 1 TABLET BY MOUTH ONCE DAILY IN THE EVENING    lisinopril (ZESTRIL) 2.5 MG tablet Take 1 tablet by mouth twice daily   metFORMIN (GLUCOPHAGE) 1000 MG tablet TAKE 1 TABLET BY MOUTH TWICE DAILY WITH MEALS   Multiple Vitamin (MULTIVITAMIN WITH MINERALS) TABS tablet Take 1 tablet by mouth daily.   OPTIVE 0.5-0.9 % ophthalmic solution 1 drop daily.   OVER THE COUNTER MEDICATION CLEAR EYES FOR REDNESS AS NEEDED   sildenafil (REVATIO) 20 MG tablet Take 1 tablet (20 mg total) by mouth daily as needed.   vitamin B-12 (CYANOCOBALAMIN) 1000 MCG tablet Take 1,000 mcg by mouth daily.   No facility-administered encounter medications on file as of 03/03/2022.    Allergies (verified) Patient has no known allergies.   History: Past Medical History:  Diagnosis Date   Allergy    "pollen"   Blood transfusion without reported diagnosis    with nasal sinus surgery   Cataract    BILATERAL,REMOVED   Diabetes mellitus without complication ([place]    Fatty liver    "SOME EVIDENCE ON IMAGING EXAM"   Hyperlipidemia    Hypertension    Past Surgical History:  Procedure Laterality Date   ABDOMINAL SURGERY     scrapnel removed    CATARACTS Bilateral    COLONOSCOPY  03/17/2005   [place] Overall polyp   EYE SURGERY     x 7 removed metal from eyes - shrapnel   HEMORROIDECTOMY     left shoulder surgery  NASAL SINUS SURGERY     POLYPECTOMY     right shoulder surgery     Family History  Problem Relation Age of Onset   Diabetes Sister    Heart disease Brother    Colon cancer Neg Hx    Colon polyps Neg Hx    Esophageal cancer Neg Hx    Rectal cancer Neg Hx    Stomach cancer Neg Hx    Celiac disease Neg Hx    Crohn's disease Neg Hx    Social History   Socioeconomic History   Marital status: Married    Spouse name: Not on file   Number of children: Not on file   Years of education: Not on file   Highest education level: Not on file  Occupational History   Not on file  Tobacco Use   Smoking status: Former    Packs/day: 5.00    Years:  4.00    Total pack years: 20.00    Types: Cigarettes    Quit date: 08/17/1986    Years since quitting: 35.5    Passive exposure: Never   Smokeless tobacco: Never  Vaping Use   Vaping Use: Never used  Substance and Sexual Activity   Alcohol use: Yes    Alcohol/week: 10.0 standard drinks of alcohol    Types: 10 Glasses of wine per week    Comment: "SEVERAL TIMES A WEEK WITH MEALS"   Drug use: No   Sexual activity: Not on file  Other Topics Concern   Not on file  Social History Narrative   Not on file   Social Determinants of Health   Financial Resource Strain: Low Risk  (07/02/2020)   Overall Financial Resource Strain (CARDIA)    Difficulty of Paying Living Expenses: Not hard at all  Food Insecurity: No Food Insecurity (07/02/2020)   Hunger Vital Sign    Worried About Running Out of Food in the Last Year: Never true    Ran Out of Food in the Last Year: Never true  Transportation Needs: No Transportation Needs (07/02/2020)   PRAPARE - Hydrologist (Medical): No    Lack of Transportation (Non-Medical): No  Physical Activity: Sufficiently Active (07/02/2020)   Exercise Vital Sign    Days of Exercise per Week: 5 days    Minutes of Exercise per Session: 30 min  Stress: No Stress Concern Present (07/02/2020)   [place]    Feeling of Stress : Not at all  Social Connections: [place] (07/02/2020)   Social Connection and Isolation Panel [NHANES]    Frequency of Communication with Friends and Family: More than three times a week    Frequency of Social Gatherings with Friends and Family: More than three times a week    Attends Religious Services: More than 4 times per year    Active Member of [employer] or Organizations: Yes    Attends Music therapist: More than 4 times per year    Marital Status: Married    Tobacco Counseling Counseling given: Not  Answered   Clinical Intake:  Pre-visit preparation completed: Yes  Pain : No/denies pain     BMI - recorded: 36.15 Nutritional Status: BMI > 30  Obese Nutritional Risks: None Diabetes: Yes CBG done?: No Did pt. bring in CBG monitor from home?: No  How often do you need to have someone help you when you read instructions, pamphlets, or other written materials from  your doctor or pharmacy?: 1 - Never  Diabetic?yes Nutrition Risk Assessment:  Has the patient had any N/V/D within the last 2 months?  No  Does the patient have any non-healing wounds?  No  Has the patient had any unintentional weight loss or weight gain?  No   Diabetes:  Is the patient diabetic?  Yes  If diabetic, was a CBG obtained today?  No  Did the patient bring in their glucometer from home?  No  How often do you monitor your CBG's? N/a.   Financial Strains and Diabetes Management:  Are you having any financial strains with the device, your supplies or your medication? No .  Does the patient want to be seen by Chronic Care Management for management of their diabetes?  No  Would the patient like to be referred to a Nutritionist or for Diabetic Management?  No   Diabetic Exams:  Diabetic Eye Exam: Completed 05/13/21 Diabetic Foot Exam: Completed 10/10/21    Interpreter Needed?: No  Information entered by :: [place] [facility]    03/03/2022    9:57 AM  In your present state of health, do you have any difficulty performing the following activities:  Hearing? 0  Vision? 0  Difficulty concentrating or making decisions? 0  Walking or climbing stairs? 0  Dressing or bathing? 0  Doing errands, shopping? 0  Preparing Food and eating ? N  Using the Toilet? N  In the past six months, have you accidently leaked urine? N  Do you have problems with loss of bowel control? N  Managing your Medications? N  Managing your Finances? N  Housekeeping or managing your Housekeeping? N     Patient Care Team: Marrian Salvage, [place] as PCP - General (Internal Medicine) Lynnell Dike, OD as Consulting Physician (Optometry)  Indicate any recent Medical Services you may have received from other than [facility] providers in the past year (date may be approximate).     Assessment:   This is a routine wellness examination for Zadyn.  Hearing/Vision screen No results found.  Dietary issues and exercise activities discussed: Current Exercise Habits: Home exercise routine, Type of exercise: walking;strength training/weights;stretching, Time (Minutes): 60, Frequency (Times/Week): 5, Weekly Exercise (Minutes/Week): 300, Intensity: Mild, Exercise limited by: None identified   Goals Addressed               This Visit's Progress     Patient Stated (pt-stated)   Not on track     My goal is to lose at least 20 pounds.       Depression Screen    03/03/2022    9:53 AM 03/03/2022    9:23 AM 10/10/2021    8:41 AM 03/07/2021    8:52 AM 07/02/2020    2:51 PM 07/01/2020    8:41 AM 06/30/2019    9:25 AM  PHQ 2/9 Scores  PHQ - 2 Score 0 0 0 0 0 0 0    Fall Risk    03/03/2022    9:52 AM 03/03/2022    9:22 AM 10/10/2021    8:41 AM 03/07/2021    8:52 AM 07/02/2020    2:53 PM  Fall Risk   Falls in the past year? 1  1 0 1  Number falls in past yr: '1 1 1 '$ 0 1  Injury with Fall? 0 1 1 0 0  Risk for fall due to : Impaired balance/gait  History of fall(s);Impaired balance/gait No Fall Risks  Follow up Falls evaluation completed  Falls evaluation completed;Falls prevention discussed Falls evaluation completed Falls evaluation completed    FALL RISK PREVENTION PERTAINING TO THE HOME:  Any stairs in or around the home? Yes  If so, are there any without handrails? No  Home free of loose throw rugs in walkways, pet beds, electrical cords, etc? Yes  Adequate lighting in your home to reduce risk of falls? Yes   ASSISTIVE DEVICES UTILIZED TO PREVENT FALLS:  Life alert? No  Use  of a cane, walker or w/c? No  Grab bars in the bathroom? No  Shower chair or bench in shower? No  Elevated toilet seat or a handicapped toilet? Yes   TIMED UP AND GO:  Was the test performed? Yes .  Length of time to ambulate 10 feet: 9 sec.   Gait steady and fast without use of assistive device  Cognitive Function:        03/03/2022   10:02 AM  6CIT Screen  What Year? 0 points  What month? 0 points  What time? 0 points  Count back from 20 0 points  Months in reverse 0 points  Repeat phrase 0 points  Total Score 0 points    Immunizations Immunization History  Administered Date(s) Administered   Influenza, High Dose Seasonal PF 05/31/2018, 04/26/2019   Influenza-Unspecified 05/15/2020   PFIZER(Purple Top)SARS-COV-2 Vaccination 10/16/2019, 11/06/2019, 05/21/2020   PNEUMOCOCCAL CONJUGATE-20 04/23/2021   Pneumococcal Conjugate-13 04/26/2019   Pneumococcal Polysaccharide-23 05/17/2020   Tdap 11/27/2021   Zoster Recombinat (Shingrix) 03/28/2018, 05/31/2018    TDAP status: Up to date  Flu Vaccine status: Due, Education has been provided regarding the importance of this vaccine. Advised may receive this vaccine at local pharmacy or Health Dept. Aware to provide a copy of the vaccination record if obtained from local pharmacy or Health Dept. Verbalized acceptance and understanding.  Pneumococcal vaccine status: Up to date  Covid-19 vaccine status: Information provided on how to obtain vaccines.   Qualifies for Shingles Vaccine? Yes   Zostavax completed No   Shingrix Completed?: Yes  Screening Tests Health Maintenance  Topic Date Due   INFLUENZA VACCINE  03/17/2022   HEMOGLOBIN A1C  04/09/2022   OPHTHALMOLOGY EXAM  05/13/2022   FOOT EXAM  10/10/2022   TETANUS/TDAP  11/28/2031   Pneumonia Vaccine 74+ Years old  Completed   Hepatitis C Screening  Completed   Zoster Vaccines- Shingrix  Completed   HPV VACCINES  Aged Out   COLONOSCOPY (Pts 45-71yr Insurance coverage  will need to be confirmed)  Discontinued   COVID-19 Vaccine  Discontinued    Health Maintenance  There are no preventive care reminders to display for this patient.   Colorectal cancer screening: Type of screening: Colonoscopy. Completed 11/26/21. Repeat every 10 years  Lung Cancer Screening: (Low Dose CT Chest recommended if Age 73-80years, 30 pack-year currently smoking OR have quit w/in 15years.) does not qualify.   Lung Cancer Screening Referral: n/a  Additional Screening:  Hepatitis C Screening: does qualify; Completed 10/10/21  Vision Screening: Recommended annual ophthalmology exams for early detection of glaucoma and other disorders of the eye. Is the patient up to date with their annual eye exam?  Yes  Who is the provider or what is the name of the office in which the patient attends annual eye exams[place] a provider, would they like to be referred to a provider to establish care? No .   Dental  Screening: Recommended annual dental exams for proper oral hygiene  Community Resource Referral / Chronic Care Management: CRR required this visit?  No   CCM required this visit?  No      Plan:     I have personally reviewed and noted the following in the patient's chart:   Medical and social history Use of alcohol, tobacco or illicit drugs  Current medications and supplements including opioid prescriptions. Patient is not currently taking opioid prescriptions. Functional ability and status Nutritional status Physical activity Advanced directives List of other physicians Hospitalizations, surgeries, and ER visits in previous 12 months Vitals Screenings to include cognitive, depression, and falls Referrals and appointments  In addition, I have reviewed and discussed with patient certain preventive protocols, quality metrics, and best practice recommendations. A written personalized care plan for preventive services as  well as general preventive health recommendations were provided to patient.     Duard Brady Giulio Bertino, [employer]   03/03/2022   Nurse Notes: none  Medical screening examination/treatment/procedure(s) were performed by non-physician practitioner and as supervising provider I was immediately available for consultation/collaboration.  I agree with above. Marrian Salvage, FNP

## 2022-03-03 ENCOUNTER — Ambulatory Visit (INDEPENDENT_AMBULATORY_CARE_PROVIDER_SITE_OTHER): Payer: Medicare HMO

## 2022-03-03 ENCOUNTER — Ambulatory Visit (INDEPENDENT_AMBULATORY_CARE_PROVIDER_SITE_OTHER): Payer: Medicare HMO | Admitting: Family

## 2022-03-03 ENCOUNTER — Encounter: Payer: Self-pay | Admitting: Family

## 2022-03-03 VITALS — BP 136/82 | HR 84 | Temp 98.0°F | Resp 16 | Ht 73.0 in | Wt 274.0 lb

## 2022-03-03 DIAGNOSIS — Z0000 Encounter for general adult medical examination without abnormal findings: Secondary | ICD-10-CM

## 2022-03-03 DIAGNOSIS — E119 Type 2 diabetes mellitus without complications: Secondary | ICD-10-CM

## 2022-03-03 LAB — MICROALBUMIN / CREATININE URINE RATIO
Creatinine,U: 83.3 mg/dL
Microalb Creat Ratio: 6.3 mg/g (ref 0.0–30.0)
Microalb, Ur: 5.2 mg/dL — ABNORMAL HIGH (ref 0.0–1.9)

## 2022-03-03 LAB — CBC WITH DIFFERENTIAL/PLATELET
Basophils Absolute: 0 10*3/uL (ref 0.0–0.1)
Basophils Relative: 0.3 % (ref 0.0–3.0)
Eosinophils Absolute: 0.3 10*3/uL (ref 0.0–0.7)
Eosinophils Relative: 4.2 % (ref 0.0–5.0)
HCT: 43.4 % (ref 39.0–52.0)
Hemoglobin: 14.8 g/dL (ref 13.0–17.0)
Lymphocytes Relative: 13.8 % (ref 12.0–46.0)
Lymphs Abs: 1 10*3/uL (ref 0.7–4.0)
MCHC: 34.2 g/dL (ref 30.0–36.0)
MCV: 93.1 fl (ref 78.0–100.0)
Monocytes Absolute: 0.8 10*3/uL (ref 0.1–1.0)
Monocytes Relative: 10.9 % (ref 3.0–12.0)
Neutro Abs: 5 10*3/uL (ref 1.4–7.7)
Neutrophils Relative %: 70.8 % (ref 43.0–77.0)
Platelets: 256 10*3/uL (ref 150.0–400.0)
RBC: 4.66 Mil/uL (ref 4.22–5.81)
RDW: 12.6 % (ref 11.5–15.5)
WBC: 7.1 10*3/uL (ref 4.0–10.5)

## 2022-03-03 LAB — COMPREHENSIVE METABOLIC PANEL
ALT: 34 U/L (ref 0–53)
AST: 31 U/L (ref 0–37)
Albumin: 4.6 g/dL (ref 3.5–5.2)
Alkaline Phosphatase: 90 U/L (ref 39–117)
BUN: 16 mg/dL (ref 6–23)
CO2: 32 mEq/L (ref 19–32)
Calcium: 9.8 mg/dL (ref 8.4–10.5)
Chloride: 96 mEq/L (ref 96–112)
Creatinine, Ser: 1.05 mg/dL (ref 0.40–1.50)
GFR: 70.63 mL/min (ref >60.00)
Glucose, Bld: 152 mg/dL — ABNORMAL HIGH (ref 70–99)
Potassium: 4.8 mEq/L (ref 3.5–5.1)
Sodium: 134 mEq/L — ABNORMAL LOW (ref 135–145)
Total Bilirubin: 1.2 mg/dL (ref 0.2–1.2)
Total Protein: 7.3 g/dL (ref 6.0–8.3)

## 2022-03-03 LAB — HEMOGLOBIN A1C: Hgb A1c MFr Bld: 7.5 % — ABNORMAL HIGH (ref 4.6–6.5)

## 2022-03-03 NOTE — Patient Instructions (Signed)
Brian [name] , Thank you for taking time to come for your Medicare Wellness Visit. I appreciate your ongoing commitment to your health goals. Please review the following plan we discussed and let me know if I can assist you in the future.   Screening recommendations/referrals: Colonoscopy: 11/26/21 no longer needed after this one Recommended yearly ophthalmology/optometry visit for glaucoma screening and checkup Recommended yearly dental visit for hygiene and checkup  Vaccinations: Influenza vaccine: Due-May obtain vaccine at our office or your local pharmacy.  Pneumococcal vaccine: up to date Tdap vaccine: up to date Shingles vaccine: up to date   Covid-19: declined  Advanced directives: yes, not on file  Conditions/risks identified: see problem list   Next appointment: Follow up in one year for your annual wellness visit.   Preventive Care 80 Years and Older, Male Preventive care refers to lifestyle choices and visits with your health care provider that can promote health and wellness. What does preventive care include? A yearly physical exam. This is also called an annual well check. Dental exams once or twice a year. Routine eye exams. Ask your health care provider how often you should have your eyes checked. Personal lifestyle choices, including: Daily care of your teeth and gums. Regular physical activity. Eating a healthy diet. Avoiding tobacco and drug use. Limiting alcohol use. Practicing safe sex. Taking low doses of aspirin every day. Taking vitamin and mineral supplements as recommended by your health care provider. What happens during an annual well check? The services and screenings done by your health care provider during your annual well check will depend on your age, overall health, lifestyle risk factors, and family history of disease. Counseling  Your health care provider may ask you questions about your: Alcohol use. Tobacco use. Drug use. Emotional  well-being. Home and relationship well-being. Sexual activity. Eating habits. History of falls. Memory and ability to understand (cognition). Work and work Statistician. Screening  You may have the following tests or measurements: Height, weight, and BMI. Blood pressure. Lipid and cholesterol levels. These may be checked every 5 years, or more frequently if you are over 91 years old. Skin check. Lung cancer screening. You may have this screening every year starting at age 54 if you have a 30-pack-year history of smoking and currently smoke or have quit within the past 15 years. Fecal occult blood test (FOBT) of the stool. You may have this test every year starting at age 70. Flexible sigmoidoscopy or colonoscopy. You may have a sigmoidoscopy every 5 years or a colonoscopy every 10 years starting at age 57. Prostate cancer screening. Recommendations will vary depending on your family history and other risks. Hepatitis C blood test. Hepatitis B blood test. Sexually transmitted disease (STD) testing. Diabetes screening. This is done by checking your blood sugar (glucose) after you have not eaten for a while (fasting). You may have this done every 1-3 years. Abdominal aortic aneurysm (AAA) screening. You may need this if you are a current or former smoker. Osteoporosis. You may be screened starting at age 47 if you are at high risk. Talk with your health care provider about your test results, treatment options, and if necessary, the need for more tests. Vaccines  Your health care provider may recommend certain vaccines, such as: Influenza vaccine. This is recommended every year. Tetanus, diphtheria, and acellular pertussis (Tdap, Td) vaccine. You may need a Td booster every 10 years. Zoster vaccine. You may need this after age 73. Pneumococcal 13-valent conjugate (PCV13) vaccine. One dose  is recommended after age 16. Pneumococcal polysaccharide (PPSV23) vaccine. One dose is recommended after  age 51. Talk to your health care provider about which screenings and vaccines you need and how often you need them. This information is not intended to replace advice given to you by your health care provider. Make sure you discuss any questions you have with your health care provider. Document Released: 08/30/2015 Document Revised: 04/22/2016 Document Reviewed: 06/04/2015 Elsevier Interactive Patient Education  2017 [place] Prevention in the Home Falls can cause injuries. They can happen to people of all ages. There are many things you can do to make your home safe and to help prevent falls. What can I do on the outside of my home? Regularly fix the edges of walkways and driveways and fix any cracks. Remove anything that might make you trip as you walk through a door, such as a raised step or threshold. Trim any bushes or trees on the path to your home. Use bright outdoor lighting. Clear any walking paths of anything that might make someone trip, such as rocks or tools. Regularly check to see if handrails are loose or broken. Make sure that both sides of any steps have handrails. Any raised decks and porches should have guardrails on the edges. Have any leaves, snow, or ice cleared regularly. Use sand or salt on walking paths during winter. Clean up any spills in your garage right away. This includes oil or grease spills. What can I do in the bathroom? Use night lights. Install grab bars by the toilet and in the tub and shower. Do not use towel bars as grab bars. Use non-skid mats or decals in the tub or shower. If you need to sit down in the shower, use a plastic, non-slip stool. Keep the floor dry. Clean up any water that spills on the floor as soon as it happens. Remove soap buildup in the tub or shower regularly. Attach bath mats securely with double-sided non-slip rug tape. Do not have throw rugs and other things on the floor that can make you trip. What can I do in the  bedroom? Use night lights. Make sure that you have a light by your bed that is easy to reach. Do not use any sheets or blankets that are too big for your bed. They should not hang down onto the floor. Have a firm chair that has side arms. You can use this for support while you get dressed. Do not have throw rugs and other things on the floor that can make you trip. What can I do in the kitchen? Clean up any spills right away. Avoid walking on wet floors. Keep items that you use a lot in easy-to-reach places. If you need to reach something above you, use a strong step stool that has a grab bar. Keep electrical cords out of the way. Do not use floor polish or wax that makes floors slippery. If you must use wax, use non-skid floor wax. Do not have throw rugs and other things on the floor that can make you trip. What can I do with my stairs? Do not leave any items on the stairs. Make sure that there are handrails on both sides of the stairs and use them. Fix handrails that are broken or loose. Make sure that handrails are as long as the stairways. Check any carpeting to make sure that it is firmly attached to the stairs. Fix any carpet that is loose or worn. Avoid  having throw rugs at the top or bottom of the stairs. If you do have throw rugs, attach them to the floor with carpet tape. Make sure that you have a light switch at the top of the stairs and the bottom of the stairs. If you do not have them, ask someone to add them for you. What else can I do to help prevent falls? Wear shoes that: Do not have high heels. Have rubber bottoms. Are comfortable and fit you well. Are closed at the toe. Do not wear sandals. If you use a stepladder: Make sure that it is fully opened. Do not climb a closed stepladder. Make sure that both sides of the stepladder are locked into place. Ask someone to hold it for you, if possible. Clearly mark and make sure that you can see: Any grab bars or  handrails. First and last steps. Where the edge of each step is. Use tools that help you move around (mobility aids) if they are needed. These include: Canes. Walkers. Scooters. Crutches. Turn on the lights when you go into a dark area. Replace any light bulbs as soon as they burn out. Set up your furniture so you have a clear path. Avoid moving your furniture around. If any of your floors are uneven, fix them. If there are any pets around you, be aware of where they are. Review your medicines with your doctor. Some medicines can make you feel dizzy. This can increase your chance of falling. Ask your doctor what other things that you can do to help prevent falls. This information is not intended to replace advice given to you by your health care provider. Make sure you discuss any questions you have with your health care provider. Document Released: 05/30/2009 Document Revised: 01/09/2016 Document Reviewed: 09/07/2014 Elsevier Interactive Patient Education  2017 [employer].

## 2022-03-03 NOTE — Progress Notes (Signed)
Brian [name] is a 71 y.o. male with the following history as recorded in EpicCare:  Patient Active Problem List   Diagnosis Date Noted   COVID-19 03/17/2021   Toe injury, left, initial encounter 06/12/2019   DYSPNEA ON EXERTION 07/27/2008   ELECTROCARDIOGRAM, ABNORMAL 07/27/2008   DIABETES-TYPE 2 05/07/2008   Hyperlipidemia 05/07/2008   ERECTILE DYSFUNCTION 05/07/2008   HYPERTENSION, BENIGN ESSENTIAL 05/07/2008    Current Outpatient Medications  Medication Sig Dispense Refill   allopurinol (ZYLOPRIM) 100 MG tablet Take 1 tablet by mouth once daily 90 tablet 0   aspirin EC 81 MG tablet Take 81 mg by mouth daily. Swallow whole.     atorvastatin (LIPITOR) 20 MG tablet Take 1 tablet by mouth once daily 90 tablet 0   Azelastine-Fluticasone 137-50 MCG/ACT SUSP Place 1 spray into the nose every 12 (twelve) hours. 23 g 3   fluticasone (FLONASE) 50 MCG/ACT nasal spray Use 2 spray(s) in each nostril once daily 16 g 11   gabapentin (NEURONTIN) 100 MG capsule TAKE 1 CAPSULE BY MOUTH IN THE MORNING AND 2 IN THE EVENING 270 capsule 0   glimepiride (AMARYL) 2 MG tablet Take 1 tablet by mouth twice daily 180 tablet 0   hydrochlorothiazide (HYDRODIURIL) 25 MG tablet TAKE 1 TABLET BY MOUTH ONCE DAILY FOR HIGH BLOOD PRESSURE 90 tablet 0   levocetirizine (XYZAL) 5 MG tablet TAKE 1 TABLET BY MOUTH ONCE DAILY IN THE EVENING 90 tablet 0   lisinopril (ZESTRIL) 2.5 MG tablet Take 1 tablet by mouth twice daily 180 tablet 0   metFORMIN (GLUCOPHAGE) 1000 MG tablet TAKE 1 TABLET BY MOUTH TWICE DAILY WITH MEALS 180 tablet 0   Multiple Vitamin (MULTIVITAMIN WITH MINERALS) TABS tablet Take 1 tablet by mouth daily.     OPTIVE 0.5-0.9 % ophthalmic solution 1 drop daily.     OVER THE COUNTER MEDICATION CLEAR EYES FOR REDNESS AS NEEDED     sildenafil (REVATIO) 20 MG tablet Take 1 tablet (20 mg total) by mouth daily as needed. 30 tablet 1   vitamin B-12 (CYANOCOBALAMIN) 1000 MCG tablet Take 1,000 mcg by mouth  daily.     No current facility-administered medications for this visit.    Allergies: Patient has no known allergies.  Past Medical History:  Diagnosis Date   Allergy    "pollen"   Blood transfusion without reported diagnosis    with nasal sinus surgery   Cataract    BILATERAL,REMOVED   Diabetes mellitus without complication ([place])    Fatty liver    "SOME EVIDENCE ON IMAGING EXAM"   Hyperlipidemia    Hypertension     Past Surgical History:  Procedure Laterality Date   ABDOMINAL SURGERY     scrapnel removed    CATARACTS Bilateral    COLONOSCOPY  03/17/2005   Alphonsa Overall, polyp   EYE SURGERY     x 7 removed metal from eyes - shrapnel   HEMORROIDECTOMY     left shoulder surgery     NASAL SINUS SURGERY     POLYPECTOMY     right shoulder surgery      Family History  Problem Relation Age of Onset   Diabetes Sister    Heart disease Brother    Colon cancer Neg Hx    Colon polyps Neg Hx    Esophageal cancer Neg Hx    Rectal cancer Neg Hx    Stomach cancer Neg Hx    Celiac disease Neg Hx    Crohn's disease Neg  Hx     Social History   Tobacco Use   Smoking status: Former    Packs/day: 5.00    Years: 4.00    Total pack years: 20.00    Types: Cigarettes    Quit date: 08/17/1986    Years since quitting: 35.5    Passive exposure: Never   Smokeless tobacco: Never  Substance Use Topics   Alcohol use: Yes    Alcohol/week: 10.0 standard drinks of alcohol    Types: 10 Glasses of wine per week    Comment: "SEVERAL TIMES A WEEK WITH MEALS"    Subjective:  Follow up on Type 2 Diabetes; defers any type of injectable medication; Hgba1c has hovered around 8 for the past year- in 2022, he deferred medication changes and wanted to work on diet/ exercise; Did agree to adjustments in February 2023 but wanted to use the medications he had on hand; Amaryl was increased to 2 mg bid and maintained on Metformin;  Unsure if he is actually taking statin- Rx had been recommended and  patient initially deferred but is listed on medication list?  Denies any chest pain, shortness of breath, blurred vision or headache      Objective:  Vitals:   03/03/22 0920  BP: 136/82  Pulse: 84  Resp: 16  Temp: 98 F (36.7 C)  TempSrc: Oral  SpO2: 94%  Weight: 274 lb (124.3 kg)  Height: _0  (1.854 m)    General: Well developed, well nourished, in no acute distress  Skin : Warm and dry.  Head: Normocephalic and atraumatic  Eyes: Sclera and conjunctiva clear; pupils round and reactive to light; extraocular movements intact  Ears: External normal; canals clear; tympanic membranes normal  Oropharynx: Pink, supple. No suspicious lesions  Neck: Supple without thyromegaly, adenopathy  Lungs: Respirations unlabored; clear to auscultation bilaterally without wheeze, rales, rhonchi  CVS exam: normal rate and regular rhythm.  Neurologic: Alert and oriented; speech intact; face symmetrical; moves all extremities well; CNII-XII intact without focal deficit   Assessment:  1. Type 2 diabetes mellitus without complication, without long-term current use of insulin ([place])     Plan:  Update labs today; will plan for next follow up to be with his CPE in 09/2022; He will check on medications to verify if he is actually taking Atorvastatin and let us know.   Return for After 10/11/2022.  Orders Placed This Encounter  Procedures   Urine Microalbumin w/creat. ratio   CBC with Differential/Platelet   Comp Met (CMET)   Hemoglobin A1c    Requested Prescriptions    No prescriptions requested or ordered in this encounter

## 2022-03-03 NOTE — Patient Instructions (Signed)
Please check your medications at home to see if you are taking Atorvastatin 20 mg daily.

## 2022-03-11 DIAGNOSIS — H04123 Dry eye syndrome of bilateral lacrimal glands: Secondary | ICD-10-CM | POA: Diagnosis not present

## 2022-03-11 DIAGNOSIS — H524 Presbyopia: Secondary | ICD-10-CM | POA: Diagnosis not present

## 2022-03-11 DIAGNOSIS — Z961 Presence of intraocular lens: Secondary | ICD-10-CM | POA: Diagnosis not present

## 2022-03-11 DIAGNOSIS — H26493 Other secondary cataract, bilateral: Secondary | ICD-10-CM | POA: Diagnosis not present

## 2022-03-24 DIAGNOSIS — L570 Actinic keratosis: Secondary | ICD-10-CM | POA: Diagnosis not present

## 2022-03-27 ENCOUNTER — Telehealth: Payer: Self-pay | Admitting: Family

## 2022-03-27 NOTE — Telephone Encounter (Signed)
Called pt and let him know he needed an appointment. - he stated that he is in [place].   I then suggested that he been seen at an UC- he said he hadn't seen one.   I let him know that LM was not in the office, but that I would check with DOD, then asked what pharmacy he would like to use in [facility] -He said to send it to his regular pharmacy. He would pick his prescription up once he got back.    Please advise.   Patient prefers [facility] [address], [place] - [place]  [place], [place] [address]  Phone:  [phone]  Fax:  [phone]

## 2022-03-27 NOTE — Telephone Encounter (Signed)
Patient called because he had an ingrown toenail that he cut some and then he had a pedicure and now the big toe is red. Patient is concerned their may be an infection and would like to have an antibiotic called in. There is no pain and nothing is oozing but because he is diabetic he does not want to take a chance. Please call to advise.   Patient prefers [facility] [address], [place] - [place]  [place], [place] [address]  Phone:  [phone]  Fax:  [phone]

## 2022-03-27 NOTE — Telephone Encounter (Signed)
Pt aware and voices understanding.   

## 2022-03-31 ENCOUNTER — Ambulatory Visit: Payer: Medicare HMO | Admitting: Family Medicine

## 2022-04-04 ENCOUNTER — Other Ambulatory Visit: Payer: Self-pay | Admitting: Family

## 2022-04-13 ENCOUNTER — Other Ambulatory Visit: Payer: Self-pay | Admitting: Family

## 2022-05-01 ENCOUNTER — Other Ambulatory Visit: Payer: Self-pay | Admitting: Family

## 2022-05-08 ENCOUNTER — Other Ambulatory Visit: Payer: Self-pay | Admitting: Family

## 2022-05-15 ENCOUNTER — Other Ambulatory Visit: Payer: Self-pay | Admitting: Family

## 2022-05-29 ENCOUNTER — Other Ambulatory Visit: Payer: Self-pay | Admitting: Family

## 2022-06-29 ENCOUNTER — Other Ambulatory Visit: Payer: Self-pay | Admitting: Family

## 2022-08-04 ENCOUNTER — Ambulatory Visit (INDEPENDENT_AMBULATORY_CARE_PROVIDER_SITE_OTHER): Payer: Medicare HMO | Admitting: Family

## 2022-08-04 ENCOUNTER — Encounter: Payer: Self-pay | Admitting: Family

## 2022-08-04 VITALS — BP 138/84 | HR 90 | Temp 97.7°F | Resp 18 | Ht 73.0 in | Wt 273.4 lb

## 2022-08-04 DIAGNOSIS — N529 Male erectile dysfunction, unspecified: Secondary | ICD-10-CM | POA: Diagnosis not present

## 2022-08-04 DIAGNOSIS — L03039 Cellulitis of unspecified toe: Secondary | ICD-10-CM

## 2022-08-04 MED ORDER — TADALAFIL 20 MG PO TABS: 10.0000 mg | ORAL_TABLET | ORAL | 11 refills | Status: DC | PRN

## 2022-08-04 MED ORDER — DOXYCYCLINE HYCLATE 100 MG PO TABS: 100.0000 mg | ORAL_TABLET | Freq: Two times a day (BID) | ORAL | 0 refills | Status: AC

## 2022-08-04 NOTE — Progress Notes (Signed)
Brian [name] is a 71 y.o. male with the following history as recorded in EpicCare:  Patient Active Problem List   Diagnosis Date Noted   COVID-19 03/17/2021   Toe injury, left, initial encounter 06/12/2019   DYSPNEA ON EXERTION 07/27/2008   ELECTROCARDIOGRAM, ABNORMAL 07/27/2008   DIABETES-TYPE 2 05/07/2008   Hyperlipidemia 05/07/2008   ED (erectile dysfunction) 05/07/2008   HYPERTENSION, BENIGN ESSENTIAL 05/07/2008    Current Outpatient Medications  Medication Sig Dispense Refill   allopurinol (ZYLOPRIM) 100 MG tablet Take 1 tablet by mouth once daily 90 tablet 1   aspirin EC 81 MG tablet Take 81 mg by mouth daily. Swallow whole.     atorvastatin (LIPITOR) 20 MG tablet Take 1 tablet by mouth once daily 90 tablet 1   Azelastine HCl 137 MCG/SPRAY SOLN USE 2 SPRAY(S) IN EACH NOSTRIL TWICE DAILY AS DIRECTED 30 mL 5   Azelastine-Fluticasone 137-50 MCG/ACT SUSP Place 1 spray into the nose every 12 (twelve) hours. 23 g 3   doxycycline (VIBRA-TABS) 100 MG tablet Take 1 tablet (100 mg total) by mouth 2 (two) times daily for 10 days. 20 tablet 0   fluticasone (FLONASE) 50 MCG/ACT nasal spray Place 2 sprays into both nostrils daily. 16 g 5   gabapentin (NEURONTIN) 100 MG capsule TAKE 1 CAPSULE BY MOUTH ONCE DAILY IN THE MORNING AND 2 CAPSULES ONCE DAILY IN THE EVENING 270 capsule 0   glimepiride (AMARYL) 2 MG tablet Take 1 tablet by mouth twice daily 180 tablet 1   hydrochlorothiazide (HYDRODIURIL) 25 MG tablet Take 1 tablet (25 mg total) by mouth daily. 90 tablet 1   levocetirizine (XYZAL) 5 MG tablet TAKE 1 TABLET BY MOUTH ONCE DAILY IN THE EVENING 90 tablet 1   lisinopril (ZESTRIL) 2.5 MG tablet Take 1 tablet by mouth twice daily 180 tablet 0   metFORMIN (GLUCOPHAGE) 1000 MG tablet Take 1 tablet (1,000 mg total) by mouth 2 (two) times daily with a meal. 180 tablet 1   Multiple Vitamin (MULTIVITAMIN WITH MINERALS) TABS tablet Take 1 tablet by mouth daily.     OPTIVE 0.5-0.9 %  ophthalmic solution 1 drop daily.     OVER THE COUNTER MEDICATION CLEAR EYES FOR REDNESS AS NEEDED     tadalafil (CIALIS) 20 MG tablet Take 0.5-1 tablets (10-20 mg total) by mouth every other day as needed for erectile dysfunction. 10 tablet 11   vitamin B-12 (CYANOCOBALAMIN) 1000 MCG tablet Take 1,000 mcg by mouth daily.     No current facility-administered medications for this visit.    Allergies: Patient has no known allergies.  Past Medical History:  Diagnosis Date   Allergy    "pollen"   Blood transfusion without reported diagnosis    with nasal sinus surgery   Cataract    BILATERAL,REMOVED   Diabetes mellitus without complication ([place])    Fatty liver    "SOME EVIDENCE ON IMAGING EXAM"   Hyperlipidemia    Hypertension     Past Surgical History:  Procedure Laterality Date   ABDOMINAL SURGERY     scrapnel removed    CATARACTS Bilateral    COLONOSCOPY  03/17/2005   Alphonsa Overall, polyp   EYE SURGERY     x 7 removed metal from eyes - shrapnel   HEMORROIDECTOMY     left shoulder surgery     NASAL SINUS SURGERY     POLYPECTOMY     right shoulder surgery      Family History  Problem Relation Age of  Onset   Diabetes Sister    Heart disease Brother    Colon cancer Neg Hx    Colon polyps Neg Hx    Esophageal cancer Neg Hx    Rectal cancer Neg Hx    Stomach cancer Neg Hx    Celiac disease Neg Hx    Crohn's disease Neg Hx     Social History   Tobacco Use   Smoking status: Former    Packs/day: 5.00    Years: 4.00    Total pack years: 20.00    Types: Cigarettes    Quit date: 08/17/1986    Years since quitting: 35.9    Passive exposure: Never   Smokeless tobacco: Never  Substance Use Topics   Alcohol use: Yes    Alcohol/week: 10.0 standard drinks of alcohol    Types: 10 Glasses of wine per week    Comment: "SEVERAL TIMES A WEEK WITH MEALS"    Subjective:  Had pedicure approximately 4 weeks ago; notes that technician did "knick his skin." Has been struggling with  non healing infection since that time; has been soaking in Epsom salt and using hydrogen peroxide; area of concern has stayed localized around the cuticle;   Objective:  Vitals:   08/04/22 0857  BP: 138/84  Pulse: 90  Resp: 18  Temp: 97.7 F (36.5 C)  TempSrc: Oral  SpO2: 98%  Weight: 273 lb 6.4 oz (124 kg)  Height: '6\' 1"'$  (1.854 m)    General: Well developed, well nourished, in no acute distress  Skin : Warm and dry. Mild erythema noted at base of toenail on left 1st toe; small area of pustular drainage noted on medial side of foot Head: Normocephalic and atraumatic  Lungs: Respirations unlabored;  Neurologic: Alert and oriented; speech intact; face symmetrical; moves all extremities well; CNII-XII intact without focal deficit   Assessment:  1. Paronychia of great toe   2. Erectile dysfunction, unspecified erectile dysfunction type     Plan:  Encouraged to continue Epsom salt soaks; Rx for Doxycycline to use for at least 7 days- can use up to 10 days if needed; follow up worse, no better; Will try alternative to Sildenafil; Rx for Cialis to use as directed; follow up with his response.   No follow-ups on file.  No orders of the defined types were placed in this encounter.   Requested Prescriptions   Signed Prescriptions Disp Refills   doxycycline (VIBRA-TABS) 100 MG tablet 20 tablet 0    Sig: Take 1 tablet (100 mg total) by mouth 2 (two) times daily for 10 days.   tadalafil (CIALIS) 20 MG tablet 10 tablet 11    Sig: Take 0.5-1 tablets (10-20 mg total) by mouth every other day as needed for erectile dysfunction.

## 2022-08-05 ENCOUNTER — Other Ambulatory Visit: Payer: Self-pay | Admitting: Family

## 2022-08-21 ENCOUNTER — Other Ambulatory Visit: Payer: Self-pay | Admitting: Family

## 2022-09-16 DIAGNOSIS — C44329 Squamous cell carcinoma of skin of other parts of face: Secondary | ICD-10-CM | POA: Diagnosis not present

## 2022-09-16 DIAGNOSIS — D485 Neoplasm of uncertain behavior of skin: Secondary | ICD-10-CM | POA: Diagnosis not present

## 2022-09-16 DIAGNOSIS — L570 Actinic keratosis: Secondary | ICD-10-CM | POA: Diagnosis not present

## 2022-09-16 DIAGNOSIS — D044 Carcinoma in situ of skin of scalp and neck: Secondary | ICD-10-CM | POA: Diagnosis not present

## 2022-10-06 ENCOUNTER — Other Ambulatory Visit: Payer: Self-pay | Admitting: Family

## 2022-10-06 DIAGNOSIS — C44329 Squamous cell carcinoma of skin of other parts of face: Secondary | ICD-10-CM | POA: Diagnosis not present

## 2022-10-06 DIAGNOSIS — D485 Neoplasm of uncertain behavior of skin: Secondary | ICD-10-CM | POA: Diagnosis not present

## 2022-10-06 DIAGNOSIS — L814 Other melanin hyperpigmentation: Secondary | ICD-10-CM | POA: Diagnosis not present

## 2022-10-13 ENCOUNTER — Ambulatory Visit (INDEPENDENT_AMBULATORY_CARE_PROVIDER_SITE_OTHER): Payer: Medicare HMO | Admitting: Family

## 2022-10-13 ENCOUNTER — Encounter: Payer: Self-pay | Admitting: Family

## 2022-10-13 VITALS — BP 138/76 | HR 82 | Resp 18 | Ht 73.0 in | Wt 275.0 lb

## 2022-10-13 DIAGNOSIS — E782 Mixed hyperlipidemia: Secondary | ICD-10-CM

## 2022-10-13 DIAGNOSIS — M1A9XX0 Chronic gout, unspecified, without tophus (tophi): Secondary | ICD-10-CM

## 2022-10-13 DIAGNOSIS — E119 Type 2 diabetes mellitus without complications: Secondary | ICD-10-CM | POA: Diagnosis not present

## 2022-10-13 DIAGNOSIS — Z125 Encounter for screening for malignant neoplasm of prostate: Secondary | ICD-10-CM | POA: Diagnosis not present

## 2022-10-13 DIAGNOSIS — I10 Essential (primary) hypertension: Secondary | ICD-10-CM

## 2022-10-13 LAB — COMPREHENSIVE METABOLIC PANEL
ALT: 33 U/L (ref 0–53)
AST: 34 U/L (ref 0–37)
Albumin: 4.2 g/dL (ref 3.5–5.2)
Alkaline Phosphatase: 92 U/L (ref 39–117)
BUN: 17 mg/dL (ref 6–23)
CO2: 30 mEq/L (ref 19–32)
Calcium: 9.7 mg/dL (ref 8.4–10.5)
Chloride: 95 mEq/L — ABNORMAL LOW (ref 96–112)
Creatinine, Ser: 0.96 mg/dL (ref 0.40–1.50)
GFR: 78.32 mL/min (ref >60.00)
Glucose, Bld: 161 mg/dL — ABNORMAL HIGH (ref 70–99)
Potassium: 4.4 mEq/L (ref 3.5–5.1)
Sodium: 133 mEq/L — ABNORMAL LOW (ref 135–145)
Total Bilirubin: 1.2 mg/dL (ref 0.2–1.2)
Total Protein: 7 g/dL (ref 6.0–8.3)

## 2022-10-13 LAB — CBC WITH DIFFERENTIAL/PLATELET
Basophils Absolute: 0.1 10*3/uL (ref 0.0–0.1)
Basophils Relative: 0.7 % (ref 0.0–3.0)
Eosinophils Absolute: 0.3 10*3/uL (ref 0.0–0.7)
Eosinophils Relative: 3.7 % (ref 0.0–5.0)
HCT: 42.7 % (ref 39.0–52.0)
Hemoglobin: 14.9 g/dL (ref 13.0–17.0)
Lymphocytes Relative: 10.6 % — ABNORMAL LOW (ref 12.0–46.0)
Lymphs Abs: 0.9 10*3/uL (ref 0.7–4.0)
MCHC: 35 g/dL (ref 30.0–36.0)
MCV: 93 fl (ref 78.0–100.0)
Monocytes Absolute: 0.9 10*3/uL (ref 0.1–1.0)
Monocytes Relative: 10.5 % (ref 3.0–12.0)
Neutro Abs: 6.6 10*3/uL (ref 1.4–7.7)
Neutrophils Relative %: 74.5 % (ref 43.0–77.0)
Platelets: 270 10*3/uL (ref 150.0–400.0)
RBC: 4.59 Mil/uL (ref 4.22–5.81)
RDW: 12.8 % (ref 11.5–15.5)
WBC: 8.9 10*3/uL (ref 4.0–10.5)

## 2022-10-13 LAB — LIPID PANEL
Cholesterol: 115 mg/dL (ref 0–200)
HDL: 26.2 mg/dL — ABNORMAL LOW (ref >39.00)
NonHDL: 88.66
Total CHOL/HDL Ratio: 4
Triglycerides: 279 mg/dL — ABNORMAL HIGH (ref 0.0–149.0)
VLDL: 55.8 mg/dL — ABNORMAL HIGH (ref 0.0–40.0)

## 2022-10-13 LAB — PSA: PSA: 2.09 ng/mL (ref 0.10–4.00)

## 2022-10-13 LAB — URIC ACID: Uric Acid, Serum: 6.5 mg/dL (ref 4.0–7.8)

## 2022-10-13 LAB — HEMOGLOBIN A1C: Hgb A1c MFr Bld: 7.3 % — ABNORMAL HIGH (ref 4.6–6.5)

## 2022-10-13 LAB — LDL CHOLESTEROL, DIRECT: Direct LDL: 59 mg/dL

## 2022-10-13 MED ORDER — ALLOPURINOL 300 MG PO TABS: 300.0000 mg | ORAL_TABLET | Freq: Every day | ORAL | 3 refills | Status: DC

## 2022-10-13 NOTE — Progress Notes (Signed)
Brian [name] is a 72 y.o. male with the following history as recorded in EpicCare:  Patient Active Problem List   Diagnosis Date Noted   COVID-19 03/17/2021   Toe injury, left, initial encounter 06/12/2019   DYSPNEA ON EXERTION 07/27/2008   ELECTROCARDIOGRAM, ABNORMAL 07/27/2008   DIABETES-TYPE 2 05/07/2008   Hyperlipidemia 05/07/2008   ED (erectile dysfunction) 05/07/2008   HYPERTENSION, BENIGN ESSENTIAL 05/07/2008    Current Outpatient Medications  Medication Sig Dispense Refill   aspirin EC 81 MG tablet Take 81 mg by mouth daily. Swallow whole.     atorvastatin (LIPITOR) 20 MG tablet Take 1 tablet by mouth once daily 90 tablet 1   Azelastine HCl 137 MCG/SPRAY SOLN USE 2 SPRAY(S) IN EACH NOSTRIL TWICE DAILY AS DIRECTED 30 mL 5   Azelastine-Fluticasone 137-50 MCG/ACT SUSP Place 1 spray into the nose every 12 (twelve) hours. 23 g 3   fluticasone (FLONASE) 50 MCG/ACT nasal spray Place 2 sprays into both nostrils daily. 16 g 5   gabapentin (NEURONTIN) 100 MG capsule TAKE 1 CAPSULE BY MOUTH IN THE MORNING AND 2 IN THE EVENING 270 capsule 0   glimepiride (AMARYL) 2 MG tablet Take 1 tablet by mouth twice daily 180 tablet 1   hydrochlorothiazide (HYDRODIURIL) 25 MG tablet Take 1 tablet (25 mg total) by mouth daily. 90 tablet 1   levocetirizine (XYZAL) 5 MG tablet TAKE 1 TABLET BY MOUTH ONCE DAILY IN THE EVENING 90 tablet 1   lisinopril (ZESTRIL) 2.5 MG tablet Take 1 tablet by mouth twice daily 180 tablet 0   metFORMIN (GLUCOPHAGE) 1000 MG tablet TAKE 1 TABLET BY MOUTH TWICE DAILY WITH MEALS 180 tablet 0   Multiple Vitamin (MULTIVITAMIN WITH MINERALS) TABS tablet Take 1 tablet by mouth daily.     OVER THE COUNTER MEDICATION CLEAR EYES FOR REDNESS AS NEEDED     tadalafil (CIALIS) 20 MG tablet Take 0.5-1 tablets (10-20 mg total) by mouth every other day as needed for erectile dysfunction. 10 tablet 11   vitamin B-12 (CYANOCOBALAMIN) 1000 MCG tablet Take 1,000 mcg by mouth daily.      allopurinol (ZYLOPRIM) 300 MG tablet Take 1 tablet (300 mg total) by mouth daily. 90 tablet 3   No current facility-administered medications for this visit.    Allergies: Patient has no known allergies.  Past Medical History:  Diagnosis Date   Allergy    "pollen"   Blood transfusion without reported diagnosis    with nasal sinus surgery   Cataract    BILATERAL,REMOVED   Diabetes mellitus without complication ([place])    Fatty liver    "SOME EVIDENCE ON IMAGING EXAM"   Hyperlipidemia    Hypertension     Past Surgical History:  Procedure Laterality Date   ABDOMINAL SURGERY     scrapnel removed    CATARACTS Bilateral    COLONOSCOPY  03/17/2005   Alphonsa Overall, polyp   EYE SURGERY     x 7 removed metal from eyes - shrapnel   HEMORROIDECTOMY     left shoulder surgery     NASAL SINUS SURGERY     POLYPECTOMY     right shoulder surgery      Family History  Problem Relation Age of Onset   Diabetes Sister    Heart disease Brother    Colon cancer Neg Hx    Colon polyps Neg Hx    Esophageal cancer Neg Hx    Rectal cancer Neg Hx    Stomach cancer Neg Hx  Celiac disease Neg Hx    Crohn's disease Neg Hx     Social History   Tobacco Use   Smoking status: Former    Packs/day: 5.00    Years: 4.00    Total pack years: 20.00    Types: Cigarettes    Quit date: 08/17/1986    Years since quitting: 36.1    Passive exposure: Never   Smokeless tobacco: Never  Substance Use Topics   Alcohol use: Yes    Alcohol/week: 10.0 standard drinks of alcohol    Types: 10 Glasses of wine per week    Comment: "SEVERAL TIMES A WEEK WITH MEALS"    Subjective:   6 month follow up on chronic care needs- Type 2 Diabetes;  Does check blood pressure at home- typically in the 120s/130s/ 80s; does feel he has some "white coat hypertension"; Denies any chest pain, shortness of breath, blurred vision or headache  Working with dermatology for squamous cell/ Mohs surgery- scheduled for 2 more Mohs  procedures next month;   Objective:  Vitals:   10/13/22 0856  BP: 138/76  Pulse: 82  Resp: 18  SpO2: 97%  Weight: 275 lb (124.7 kg)  Height: '6\' 1"'$  (1.854 m)    General: Well developed, well nourished, in no acute distress  Skin : Warm and dry.  Head: Normocephalic and atraumatic  Eyes: Sclera and conjunctiva clear; pupils round and reactive to light; extraocular movements intact  Ears: External normal; canals clear; tympanic membranes normal  Oropharynx: Pink, supple. No suspicious lesions  Neck: Supple without thyromegaly, adenopathy  Lungs: Respirations unlabored; clear to auscultation bilaterally without wheeze, rales, rhonchi  CVS exam: normal rate and regular rhythm.  Neurologic: Alert and oriented; speech intact; face symmetrical; moves all extremities well; CNII-XII intact without focal deficit   Assessment:  1. Controlled type 2 diabetes mellitus without complication, without long-term current use of insulin ([place])   2. Mixed hyperlipidemia   3. Prostate cancer screening   4. Chronic gout without tophus, unspecified cause, unspecified site   5. HYPERTENSION, BENIGN ESSENTIAL     Plan:  Update labs today; follow up in 6 months, sooner prn. Check lipid panel today;  Check PSA today;  Increase Allopurinol to 300 mg daily; Patient notes he gets nervous at office and pressure is always normal at home- he will continue to monitor regularly and follow up if consistently above 140/ 90;     Return in about 6 months (around 04/13/2023).  Orders Placed This Encounter  Procedures   CBC with Differential/Platelet   Comp Met (CMET)   Lipid panel   PSA   Hemoglobin A1c   Uric acid    Requested Prescriptions   Signed Prescriptions Disp Refills   allopurinol (ZYLOPRIM) 300 MG tablet 90 tablet 3    Sig: Take 1 tablet (300 mg total) by mouth daily.

## 2022-10-19 ENCOUNTER — Other Ambulatory Visit: Payer: Self-pay | Admitting: Family

## 2022-10-21 DIAGNOSIS — C4442 Squamous cell carcinoma of skin of scalp and neck: Secondary | ICD-10-CM | POA: Diagnosis not present

## 2022-10-28 DIAGNOSIS — C44329 Squamous cell carcinoma of skin of other parts of face: Secondary | ICD-10-CM | POA: Diagnosis not present

## 2022-10-31 ENCOUNTER — Other Ambulatory Visit: Payer: Self-pay | Admitting: Family

## 2022-11-13 ENCOUNTER — Other Ambulatory Visit: Payer: Self-pay | Admitting: Family

## 2022-11-17 ENCOUNTER — Other Ambulatory Visit: Payer: Self-pay | Admitting: Family

## 2023-01-01 DIAGNOSIS — D225 Melanocytic nevi of trunk: Secondary | ICD-10-CM | POA: Diagnosis not present

## 2023-01-01 DIAGNOSIS — L570 Actinic keratosis: Secondary | ICD-10-CM | POA: Diagnosis not present

## 2023-01-01 DIAGNOSIS — Z85828 Personal history of other malignant neoplasm of skin: Secondary | ICD-10-CM | POA: Diagnosis not present

## 2023-01-01 DIAGNOSIS — L821 Other seborrheic keratosis: Secondary | ICD-10-CM | POA: Diagnosis not present

## 2023-01-01 DIAGNOSIS — L578 Other skin changes due to chronic exposure to nonionizing radiation: Secondary | ICD-10-CM | POA: Diagnosis not present

## 2023-01-01 DIAGNOSIS — D485 Neoplasm of uncertain behavior of skin: Secondary | ICD-10-CM | POA: Diagnosis not present

## 2023-01-01 DIAGNOSIS — D044 Carcinoma in situ of skin of scalp and neck: Secondary | ICD-10-CM | POA: Diagnosis not present

## 2023-01-01 DIAGNOSIS — Z8589 Personal history of malignant neoplasm of other organs and systems: Secondary | ICD-10-CM | POA: Diagnosis not present

## 2023-01-01 DIAGNOSIS — L820 Inflamed seborrheic keratosis: Secondary | ICD-10-CM | POA: Diagnosis not present

## 2023-01-01 DIAGNOSIS — T148XXA Other injury of unspecified body region, initial encounter: Secondary | ICD-10-CM | POA: Diagnosis not present

## 2023-01-01 DIAGNOSIS — L814 Other melanin hyperpigmentation: Secondary | ICD-10-CM | POA: Diagnosis not present

## 2023-01-04 ENCOUNTER — Other Ambulatory Visit: Payer: Self-pay | Admitting: Family

## 2023-02-10 ENCOUNTER — Other Ambulatory Visit: Payer: Self-pay | Admitting: Family

## 2023-02-11 ENCOUNTER — Other Ambulatory Visit: Payer: Self-pay | Admitting: Family

## 2023-03-22 DIAGNOSIS — D044 Carcinoma in situ of skin of scalp and neck: Secondary | ICD-10-CM | POA: Diagnosis not present

## 2023-03-22 DIAGNOSIS — L570 Actinic keratosis: Secondary | ICD-10-CM | POA: Diagnosis not present

## 2023-04-13 ENCOUNTER — Encounter: Payer: Self-pay | Admitting: Family

## 2023-04-13 ENCOUNTER — Ambulatory Visit (INDEPENDENT_AMBULATORY_CARE_PROVIDER_SITE_OTHER): Payer: Medicare HMO | Admitting: Family

## 2023-04-13 VITALS — BP 128/72 | HR 79 | Temp 98.1°F | Resp 19 | Ht 73.0 in | Wt 274.8 lb

## 2023-04-13 DIAGNOSIS — M109 Gout, unspecified: Secondary | ICD-10-CM | POA: Diagnosis not present

## 2023-04-13 DIAGNOSIS — E119 Type 2 diabetes mellitus without complications: Secondary | ICD-10-CM

## 2023-04-13 DIAGNOSIS — Z7984 Long term (current) use of oral hypoglycemic drugs: Secondary | ICD-10-CM

## 2023-04-13 LAB — CBC WITH DIFFERENTIAL/PLATELET
Basophils Absolute: 0 10*3/uL (ref 0.0–0.1)
Basophils Relative: 0.5 % (ref 0.0–3.0)
Eosinophils Absolute: 0.2 10*3/uL (ref 0.0–0.7)
Eosinophils Relative: 2.9 % (ref 0.0–5.0)
HCT: 42.8 % (ref 39.0–52.0)
Hemoglobin: 14.3 g/dL (ref 13.0–17.0)
Lymphocytes Relative: 11.7 % — ABNORMAL LOW (ref 12.0–46.0)
Lymphs Abs: 0.9 10*3/uL (ref 0.7–4.0)
MCHC: 33.3 g/dL (ref 30.0–36.0)
MCV: 95.2 fl (ref 78.0–100.0)
Monocytes Absolute: 0.9 10*3/uL (ref 0.1–1.0)
Monocytes Relative: 11.6 % (ref 3.0–12.0)
Neutro Abs: 5.8 10*3/uL (ref 1.4–7.7)
Neutrophils Relative %: 73.3 % (ref 43.0–77.0)
Platelets: 274 10*3/uL (ref 150.0–400.0)
RBC: 4.49 Mil/uL (ref 4.22–5.81)
RDW: 13.2 % (ref 11.5–15.5)
WBC: 8 10*3/uL (ref 4.0–10.5)

## 2023-04-13 LAB — COMPREHENSIVE METABOLIC PANEL
ALT: 33 U/L (ref 0–53)
AST: 35 U/L (ref 0–37)
Albumin: 4.2 g/dL (ref 3.5–5.2)
Alkaline Phosphatase: 92 U/L (ref 39–117)
BUN: 17 mg/dL (ref 6–23)
CO2: 29 mEq/L (ref 19–32)
Calcium: 9.5 mg/dL (ref 8.4–10.5)
Chloride: 97 mEq/L (ref 96–112)
Creatinine, Ser: 1.1 mg/dL (ref 0.40–1.50)
GFR: 66.28 mL/min (ref >60.00)
Glucose, Bld: 160 mg/dL — ABNORMAL HIGH (ref 70–99)
Potassium: 5 mEq/L (ref 3.5–5.1)
Sodium: 134 mEq/L — ABNORMAL LOW (ref 135–145)
Total Bilirubin: 1.3 mg/dL — ABNORMAL HIGH (ref 0.2–1.2)
Total Protein: 7 g/dL (ref 6.0–8.3)

## 2023-04-13 LAB — URIC ACID: Uric Acid, Serum: 5.2 mg/dL (ref 4.0–7.8)

## 2023-04-13 LAB — HEMOGLOBIN A1C: Hgb A1c MFr Bld: 7.2 % — ABNORMAL HIGH (ref 4.6–6.5)

## 2023-04-13 NOTE — Patient Instructions (Addendum)
If you decide you want to see a shoulder specialist, please let me know;

## 2023-04-13 NOTE — Progress Notes (Signed)
Brian [name] is a 72 y.o. male with the following history as recorded in EpicCare:  Patient Active Problem List   Diagnosis Date Noted   COVID-19 03/17/2021   Toe injury, left, initial encounter 06/12/2019   DYSPNEA ON EXERTION 07/27/2008   ELECTROCARDIOGRAM, ABNORMAL 07/27/2008   DIABETES-TYPE 2 05/07/2008   Hyperlipidemia 05/07/2008   ED (erectile dysfunction) 05/07/2008   HYPERTENSION, BENIGN ESSENTIAL 05/07/2008    Current Outpatient Medications  Medication Sig Dispense Refill   allopurinol (ZYLOPRIM) 300 MG tablet Take 1 tablet (300 mg total) by mouth daily. 90 tablet 3   aspirin EC 81 MG tablet Take 81 mg by mouth daily. Swallow whole.     atorvastatin (LIPITOR) 20 MG tablet Take 1 tablet by mouth once daily 90 tablet 0   gabapentin (NEURONTIN) 100 MG capsule TAKE 1 CAPSULE BY MOUTH ONCE DAILY IN THE MORNING AND 2 CAPSULES ONCE DAILY IN THE EVENING 270 capsule 0   glimepiride (AMARYL) 2 MG tablet Take 1 tablet by mouth twice daily 180 tablet 0   hydrochlorothiazide (HYDRODIURIL) 25 MG tablet Take 1 tablet by mouth once daily 90 tablet 1   levocetirizine (XYZAL) 5 MG tablet TAKE 1 TABLET BY MOUTH ONCE DAILY IN THE EVENING 90 tablet 0   lisinopril (ZESTRIL) 2.5 MG tablet Take 1 tablet (2.5 mg total) by mouth 2 (two) times daily. 180 tablet 1   metFORMIN (GLUCOPHAGE) 1000 MG tablet TAKE 1 TABLET BY MOUTH TWICE DAILY WITH MEALS 180 tablet 0   Multiple Vitamin (MULTIVITAMIN WITH MINERALS) TABS tablet Take 1 tablet by mouth daily.     OVER THE COUNTER MEDICATION CLEAR EYES FOR REDNESS AS NEEDED     tadalafil (CIALIS) 20 MG tablet Take 0.5-1 tablets (10-20 mg total) by mouth every other day as needed for erectile dysfunction. 10 tablet 11   vitamin B-12 (CYANOCOBALAMIN) 1000 MCG tablet Take 1,000 mcg by mouth daily.     Azelastine HCl 137 MCG/SPRAY SOLN USE 2 SPRAY(S) IN EACH NOSTRIL TWICE DAILY AS DIRECTED 30 mL 5   fluticasone (FLONASE) 50 MCG/ACT nasal spray Place 2 sprays  into both nostrils daily. 16 g 5   No current facility-administered medications for this visit.    Allergies: Patient has no known allergies.  Past Medical History:  Diagnosis Date   Allergy    "pollen"   Blood transfusion without reported diagnosis    with nasal sinus surgery   Cataract    BILATERAL,REMOVED   Diabetes mellitus without complication ([facility])    Fatty liver    "SOME EVIDENCE ON IMAGING EXAM"   Hyperlipidemia    Hypertension     Past Surgical History:  Procedure Laterality Date   ABDOMINAL SURGERY     scrapnel removed    CATARACTS Bilateral    COLONOSCOPY  03/17/2005   Ovidio Kin, polyp   EYE SURGERY     x 7 removed metal from eyes - shrapnel   HEMORROIDECTOMY     left shoulder surgery     NASAL SINUS SURGERY     POLYPECTOMY     right shoulder surgery      Family History  Problem Relation Age of Onset   Diabetes Sister    Heart disease Brother    Colon cancer Neg Hx    Colon polyps Neg Hx    Esophageal cancer Neg Hx    Rectal cancer Neg Hx    Stomach cancer Neg Hx    Celiac disease Neg Hx    Crohn's  disease Neg Hx     Social History   Tobacco Use   Smoking status: Former    Current packs/day: 0.00    Average packs/day: 5.0 packs/day for 4.0 years (20.0 ttl pk-yrs)    Types: Cigarettes    Start date: 08/17/1982    Quit date: 08/17/1986    Years since quitting: 36.6    Passive exposure: Never   Smokeless tobacco: Never  Substance Use Topics   Alcohol use: Yes    Alcohol/week: 10.0 standard drinks of alcohol    Types: 10 Glasses of wine per week    Comment: "SEVERAL TIMES A WEEK WITH MEALS"    Subjective:   6 month follow up on chronic care needs; no acute concerns today; does feel that increased dosage of Allopurinol has been beneficial;   Objective:  Vitals:   04/13/23 0850  BP: 128/72  Pulse: 79  Resp: 19  Temp: 98.1 F (36.7 C)  TempSrc: Oral  SpO2: 98%  Weight: 274 lb 12.8 oz (124.6 kg)  Height: 6\' 1"  (1.854 m)    General:  Well developed, well nourished, in no acute distress  Skin : Warm and dry.  Head: Normocephalic and atraumatic  Eyes: Sclera and conjunctiva clear; pupils round and reactive to light; extraocular movements intact  Ears: External normal; canals clear; tympanic membranes normal  Oropharynx: Pink, supple. No suspicious lesions  Neck: Supple without thyromegaly, adenopathy  Lungs: Respirations unlabored; clear to auscultation bilaterally without wheeze, rales, rhonchi  CVS exam: normal rate and regular rhythm.  Neurologic: Alert and oriented; speech intact; face symmetrical; moves all extremities well; CNII-XII intact without focal deficit   Assessment:  1. Controlled type 2 diabetes mellitus without complication, without long-term current use of insulin ([facility])   2. Diabetes mellitus treated with oral medication ([facility])   3. Gout, unspecified cause, unspecified chronicity, unspecified site     Plan:   Update labs today; patient doing well- continue current medications; Improved with increased dosage of Allopurinol- continue same dosage;   No follow-ups on file.  Orders Placed This Encounter  Procedures   CBC with Differential/Platelet   Comp Met (CMET)   Hemoglobin A1c   Uric acid    Requested Prescriptions    No prescriptions requested or ordered in this encounter

## 2023-04-20 DIAGNOSIS — H524 Presbyopia: Secondary | ICD-10-CM | POA: Diagnosis not present

## 2023-04-20 DIAGNOSIS — H04123 Dry eye syndrome of bilateral lacrimal glands: Secondary | ICD-10-CM | POA: Diagnosis not present

## 2023-04-20 DIAGNOSIS — Z961 Presence of intraocular lens: Secondary | ICD-10-CM | POA: Diagnosis not present

## 2023-04-20 DIAGNOSIS — H26493 Other secondary cataract, bilateral: Secondary | ICD-10-CM | POA: Diagnosis not present

## 2023-04-22 ENCOUNTER — Other Ambulatory Visit: Payer: Self-pay | Admitting: Family

## 2023-05-05 ENCOUNTER — Other Ambulatory Visit: Payer: Self-pay | Admitting: Family

## 2023-05-06 ENCOUNTER — Other Ambulatory Visit: Payer: Self-pay | Admitting: Family

## 2023-05-12 ENCOUNTER — Other Ambulatory Visit: Payer: Self-pay | Admitting: Family

## 2023-05-13 ENCOUNTER — Other Ambulatory Visit: Payer: Self-pay | Admitting: Family

## 2023-05-24 ENCOUNTER — Other Ambulatory Visit: Payer: Self-pay | Admitting: Family

## 2023-06-01 DIAGNOSIS — L570 Actinic keratosis: Secondary | ICD-10-CM | POA: Diagnosis not present

## 2023-06-22 IMAGING — US US ABDOMEN COMPLETE
1 series · 14 of 25 positions shown · non-contrast
Comparison: None.

CLINICAL DATA: Elevated LFT

EXAM:
ABDOMEN ULTRASOUND COMPLETE

[Series 1: us abdomen complete · 0.22mm/px · 14 of 80 slices shown]
[im 1/80]
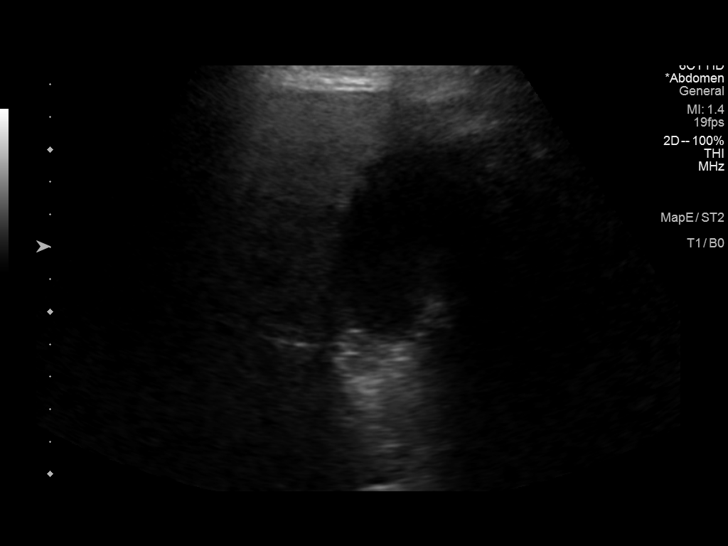
[im 7/80]
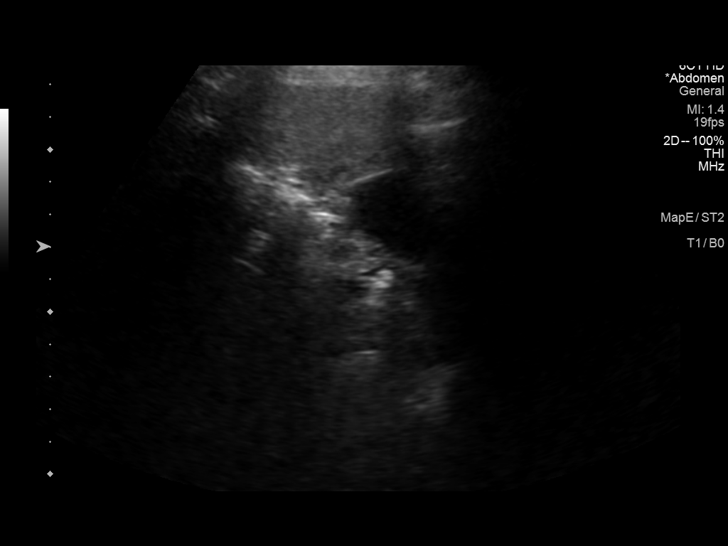
[im 14/80]
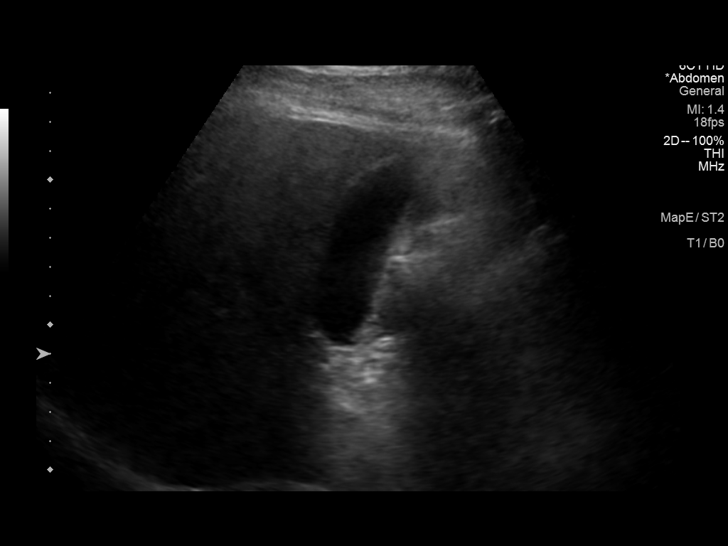
[im 20/80]
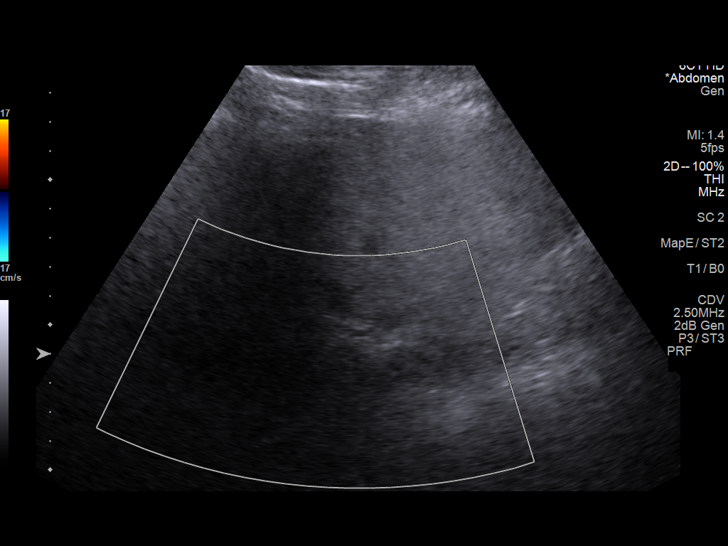
[im 27/80]
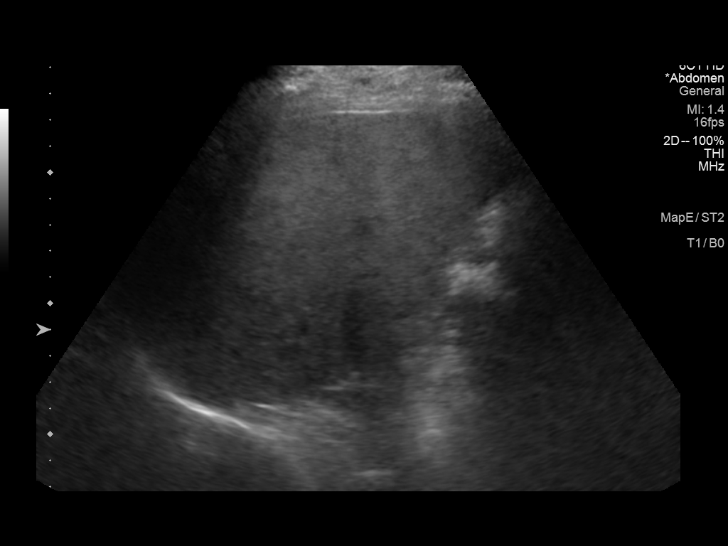
[im 30/80]
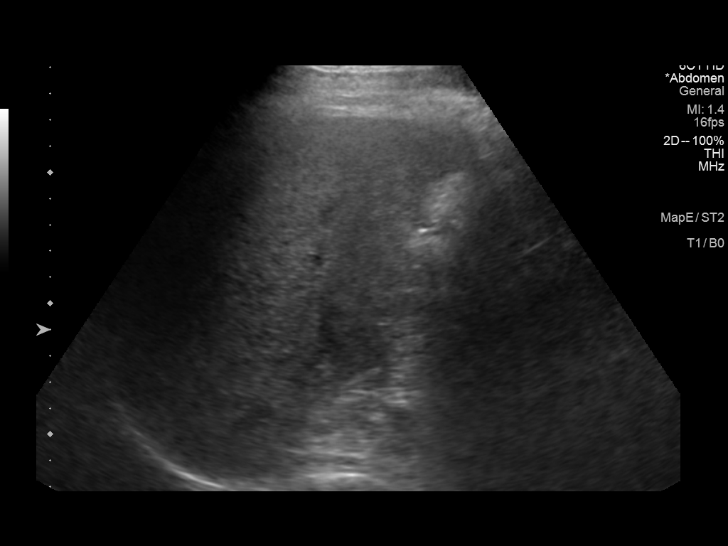
[im 37/80]
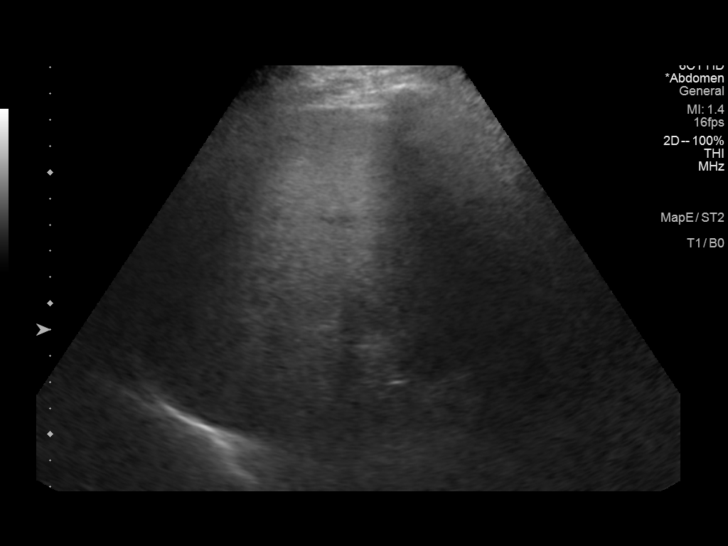
[im 43/80]
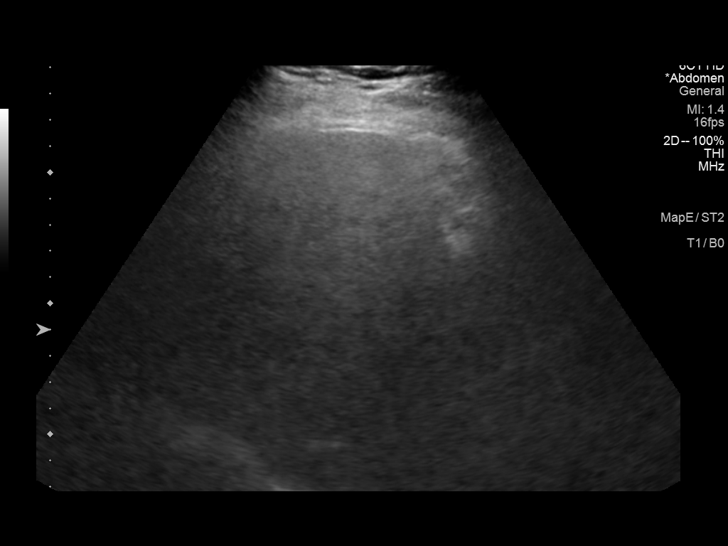
[im 50/80]
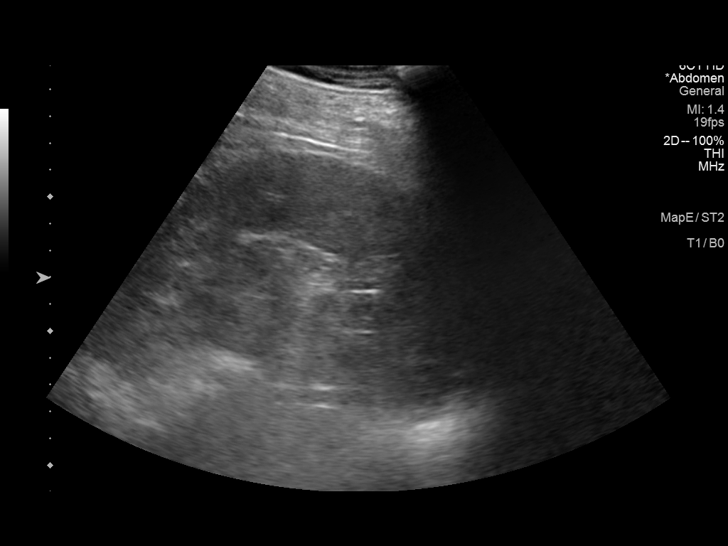
[im 53/80]
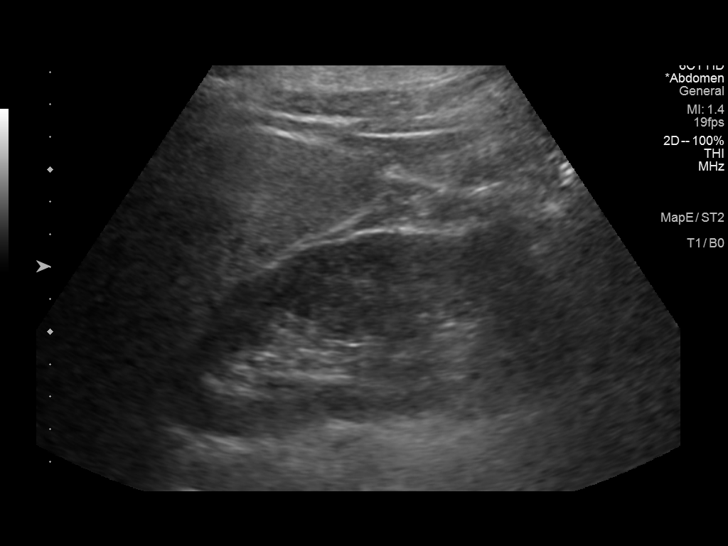
[im 60/80]
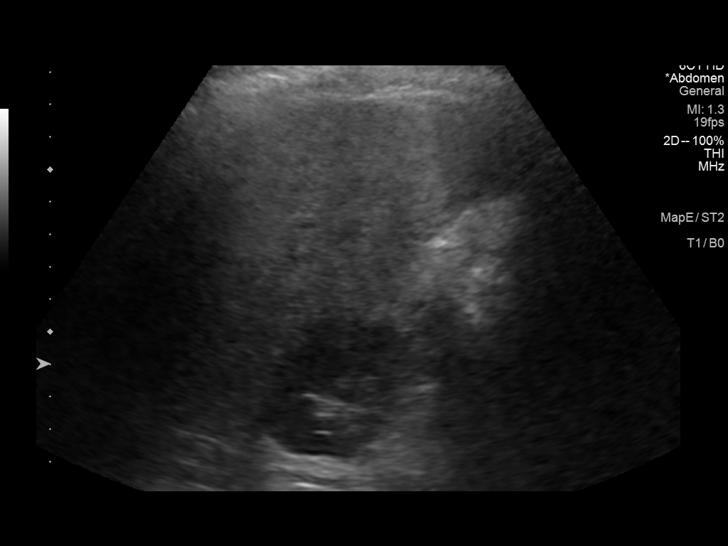
[im 66/80]
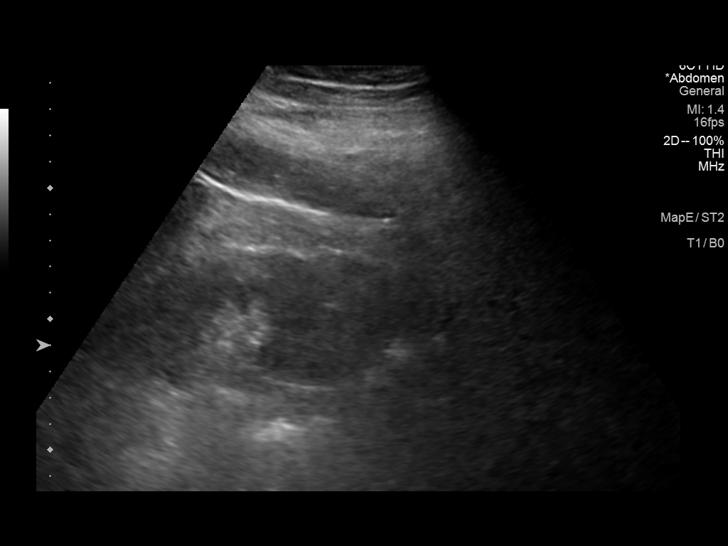
[im 73/80]
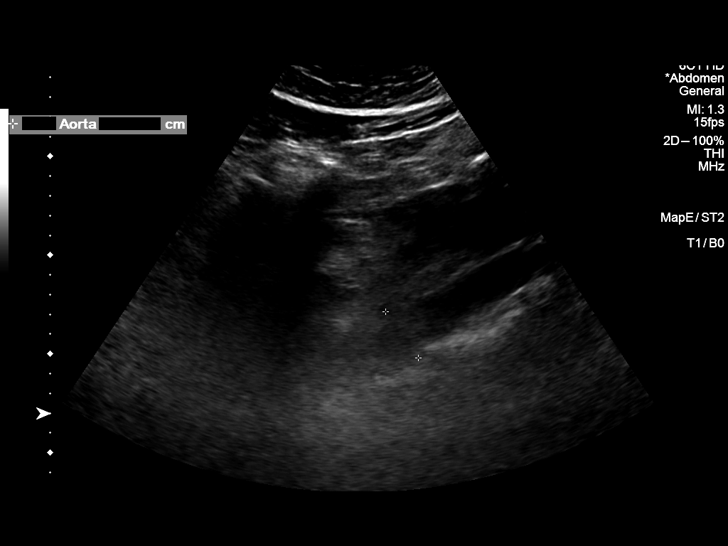
[im 80/80]
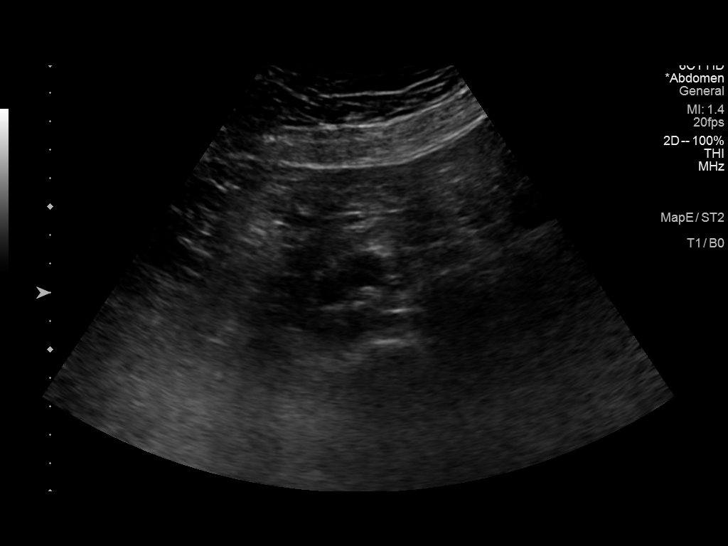

[14 of 25 positions shown; findings below may reference images not displayed]

FINDINGS: Gallbladder: No gallstones or wall thickening visualized. No
sonographic Murphy sign noted by sonographer.

Common bile duct: Diameter: 3.8 mm

Liver: Diffusely echogenic. No focal hepatic abnormality. Portal
vein is patent on color Doppler imaging with normal direction of
blood flow towards the liver.

IVC: No abnormality visualized.

Pancreas: Visualized portion unremarkable.

Spleen: Size and appearance within normal limits.

Right Kidney: Length: 11.9 cm. Echogenicity within normal limits. No
mass or hydronephrosis visualized.

Left Kidney: Length: 13.4 cm. Echogenicity within normal limits. No
mass or hydronephrosis visualized.

Abdominal aorta: No aneurysm visualized.

Other findings: None.
IMPRESSION: 1. Diffusely echogenic liver consistent with hepatic steatosis and
or hepatocellular disease.
2. Otherwise negative examination

## 2023-06-24 ENCOUNTER — Ambulatory Visit: Payer: Medicare HMO | Admitting: Family

## 2023-06-24 ENCOUNTER — Encounter: Payer: Self-pay | Admitting: Family

## 2023-06-24 VITALS — BP 150/84 | HR 88 | Resp 18 | Ht 73.0 in | Wt 276.4 lb

## 2023-06-24 DIAGNOSIS — L03039 Cellulitis of unspecified toe: Secondary | ICD-10-CM

## 2023-06-24 MED ORDER — DOXYCYCLINE HYCLATE 100 MG PO TABS
100.0000 mg | ORAL_TABLET | Freq: Two times a day (BID) | ORAL | 0 refills | Status: DC
Start: 2023-06-24 — End: 2023-10-14

## 2023-06-24 NOTE — Progress Notes (Signed)
Brian [name] is a 72 y.o. male with the following history as recorded in [employer]:  Patient Active Problem List   Diagnosis Date Noted   COVID-19 03/17/2021   Toe injury, left, initial encounter 06/12/2019   DYSPNEA ON EXERTION 07/27/2008   ELECTROCARDIOGRAM, ABNORMAL 07/27/2008   DIABETES-TYPE 2 05/07/2008   Hyperlipidemia 05/07/2008   ED (erectile dysfunction) 05/07/2008   HYPERTENSION, BENIGN ESSENTIAL 05/07/2008    Current Outpatient Medications  Medication Sig Dispense Refill   allopurinol (ZYLOPRIM) 300 MG tablet Take 1 tablet (300 mg total) by mouth daily. 90 tablet 3   aspirin EC 81 MG tablet Take 81 mg by mouth daily. Swallow whole.     atorvastatin (LIPITOR) 20 MG tablet Take 1 tablet by mouth once daily 90 tablet 0   Azelastine HCl 137 MCG/SPRAY SOLN USE 2 SPRAY(S) IN EACH NOSTRIL TWICE DAILY AS DIRECTED 30 mL 5   doxycycline (VIBRA-TABS) 100 MG tablet Take 1 tablet (100 mg total) by mouth 2 (two) times daily. 20 tablet 0   fluticasone (FLONASE) 50 MCG/ACT nasal spray Place 2 sprays into both nostrils daily. 16 g 5   gabapentin (NEURONTIN) 100 MG capsule TAKE 1 CAPSULE BY MOUTH DAILY IN THE MORNING AND 2 CAPSULES DAILY IN THE EVENING 270 capsule 0   glimepiride (AMARYL) 2 MG tablet Take 1 tablet by mouth twice daily 180 tablet 0   hydrochlorothiazide (HYDRODIURIL) 25 MG tablet Take 1 tablet (25 mg total) by mouth daily. 90 tablet 1   levocetirizine (XYZAL) 5 MG tablet Take 1 tablet (5 mg total) by mouth every evening. 90 tablet 1   lisinopril (ZESTRIL) 2.5 MG tablet Take 1 tablet by mouth twice daily 180 tablet 1   metFORMIN (GLUCOPHAGE) 1000 MG tablet Take 1 tablet (1,000 mg total) by mouth 2 (two) times daily with a meal. 180 tablet 1   Multiple Vitamin (MULTIVITAMIN WITH MINERALS) TABS tablet Take 1 tablet by mouth daily.     OVER THE COUNTER MEDICATION CLEAR EYES FOR REDNESS AS NEEDED     tadalafil (CIALIS) 20 MG tablet Take 0.5-1 tablets (10-20 mg total) by  mouth every other day as needed for erectile dysfunction. 10 tablet 11   vitamin B-12 (CYANOCOBALAMIN) 1000 MCG tablet Take 1,000 mcg by mouth daily.     No current facility-administered medications for this visit.    Allergies: Patient has no known allergies.  Past Medical History:  Diagnosis Date   Allergy    "pollen"   Blood transfusion without reported diagnosis    with nasal sinus surgery   Cataract    BILATERAL,REMOVED   Diabetes mellitus without complication (HCC)    Fatty liver    "SOME EVIDENCE ON IMAGING EXAM"   Hyperlipidemia    Hypertension     Past Surgical History:  Procedure Laterality Date   ABDOMINAL SURGERY     scrapnel removed    CATARACTS Bilateral    COLONOSCOPY  03/17/2005   Ovidio Kin, polyp   EYE SURGERY     x 7 removed metal from eyes - shrapnel   HEMORROIDECTOMY     left shoulder surgery     NASAL SINUS SURGERY     POLYPECTOMY     right shoulder surgery      Family History  Problem Relation Age of Onset   Diabetes Sister    Heart disease Brother    Colon cancer Neg Hx    Colon polyps Neg Hx    Esophageal cancer Neg Hx  Rectal cancer Neg Hx    Stomach cancer Neg Hx    Celiac disease Neg Hx    Crohn's disease Neg Hx     Social History   Tobacco Use   Smoking status: Former    Current packs/day: 0.00    Average packs/day: 5.0 packs/day for 4.0 years (20.0 ttl pk-yrs)    Types: Cigarettes    Start date: 08/17/1982    Quit date: 08/17/1986    Years since quitting: 36.8    Passive exposure: Never   Smokeless tobacco: Never  Substance Use Topics   Alcohol use: Yes    Alcohol/week: 10.0 standard drinks of alcohol    Types: 10 Glasses of wine per week    Comment: "SEVERAL TIMES A WEEK WITH MEALS"    Subjective:   Had pedicure/ ingrown toenail removed almost 2 weeks ago; concerned for infection; redness/ pustular drainage noted around left toenail;   Objective:  Vitals:   06/24/23 1358  BP: (!) 150/84  Pulse: 88  Resp: 18   SpO2: 94%  Weight: 276 lb 6.4 oz (125.4 kg)  Height: 6\' 1"  (1.854 m)    General: Well developed, well nourished, in no acute distress  Skin : Warm and dry. Erythema noted over left 1st toe/ pustular drainage Head: Normocephalic and atraumatic  Lungs: Respirations unlabored;  Musculoskeletal: No deformities; no active joint inflammation  Neurologic: Alert and oriented; speech intact; face symmetrical; moves all extremities well; CNII-XII intact without focal deficit   Assessment:  1. Paronychia of great toe     Plan:  Encouraged to try and do Epsom salt soaks 3 x per day; Rx for Doxycycline 100 mg bid x 10 days; follow up worse, no better.   No follow-ups on file.  No orders of the defined types were placed in this encounter.   Requested Prescriptions   Signed Prescriptions Disp Refills   doxycycline (VIBRA-TABS) 100 MG tablet 20 tablet 0    Sig: Take 1 tablet (100 mg total) by mouth 2 (two) times daily.

## 2023-07-06 ENCOUNTER — Ambulatory Visit (INDEPENDENT_AMBULATORY_CARE_PROVIDER_SITE_OTHER): Payer: Medicare HMO | Admitting: Family

## 2023-07-06 ENCOUNTER — Ambulatory Visit (HOSPITAL_BASED_OUTPATIENT_CLINIC_OR_DEPARTMENT_OTHER)
Admission: RE | Admit: 2023-07-06 | Discharge: 2023-07-06 | Disposition: A | Discharge status: Home / Self Care | Payer: Medicare HMO | Source: Ambulatory Visit | Attending: Family | Admitting: Family

## 2023-07-06 ENCOUNTER — Encounter: Payer: Self-pay | Admitting: Family

## 2023-07-06 VITALS — BP 142/88 | HR 91 | Resp 18 | Ht 73.0 in | Wt 272.6 lb

## 2023-07-06 DIAGNOSIS — M19072 Primary osteoarthritis, left ankle and foot: Secondary | ICD-10-CM | POA: Diagnosis not present

## 2023-07-06 DIAGNOSIS — M79672 Pain in left foot: Secondary | ICD-10-CM | POA: Diagnosis not present

## 2023-07-06 DIAGNOSIS — M7732 Calcaneal spur, left foot: Secondary | ICD-10-CM | POA: Diagnosis not present

## 2023-07-06 DIAGNOSIS — L03039 Cellulitis of unspecified toe: Secondary | ICD-10-CM

## 2023-07-06 MED ORDER — CEPHALEXIN 500 MG PO CAPS: 500.0000 mg | ORAL_CAPSULE | Freq: Three times a day (TID) | ORAL | 0 refills | Status: DC

## 2023-07-06 NOTE — Progress Notes (Signed)
Brian [name] is a 72 y.o. male with the following history as recorded in [facility]:  Patient Active Problem List   Diagnosis Date Noted   COVID-19 03/17/2021   Toe injury, left, initial encounter 06/12/2019   DYSPNEA ON EXERTION 07/27/2008   ELECTROCARDIOGRAM, ABNORMAL 07/27/2008   DIABETES-TYPE 2 05/07/2008   Hyperlipidemia 05/07/2008   ED (erectile dysfunction) 05/07/2008   HYPERTENSION, BENIGN ESSENTIAL 05/07/2008    Current Outpatient Medications  Medication Sig Dispense Refill   allopurinol (ZYLOPRIM) 300 MG tablet Take 1 tablet (300 mg total) by mouth daily. 90 tablet 3   aspirin EC 81 MG tablet Take 81 mg by mouth daily. Swallow whole.     atorvastatin (LIPITOR) 20 MG tablet Take 1 tablet by mouth once daily 90 tablet 0   Azelastine HCl 137 MCG/SPRAY SOLN USE 2 SPRAY(S) IN EACH NOSTRIL TWICE DAILY AS DIRECTED 30 mL 5   cephALEXin (KEFLEX) 500 MG capsule Take 1 capsule (500 mg total) by mouth 3 (three) times daily. 30 capsule 0   doxycycline (VIBRA-TABS) 100 MG tablet Take 1 tablet (100 mg total) by mouth 2 (two) times daily. 20 tablet 0   fluticasone (FLONASE) 50 MCG/ACT nasal spray Place 2 sprays into both nostrils daily. 16 g 5   gabapentin (NEURONTIN) 100 MG capsule TAKE 1 CAPSULE BY MOUTH DAILY IN THE MORNING AND 2 CAPSULES DAILY IN THE EVENING 270 capsule 0   glimepiride (AMARYL) 2 MG tablet Take 1 tablet by mouth twice daily 180 tablet 0   hydrochlorothiazide (HYDRODIURIL) 25 MG tablet Take 1 tablet (25 mg total) by mouth daily. 90 tablet 1   levocetirizine (XYZAL) 5 MG tablet Take 1 tablet (5 mg total) by mouth every evening. 90 tablet 1   lisinopril (ZESTRIL) 2.5 MG tablet Take 1 tablet by mouth twice daily 180 tablet 1   metFORMIN (GLUCOPHAGE) 1000 MG tablet Take 1 tablet (1,000 mg total) by mouth 2 (two) times daily with a meal. 180 tablet 1   Multiple Vitamin (MULTIVITAMIN WITH MINERALS) TABS tablet Take 1 tablet by mouth daily.     OVER THE COUNTER MEDICATION  CLEAR EYES FOR REDNESS AS NEEDED     tadalafil (CIALIS) 20 MG tablet Take 0.5-1 tablets (10-20 mg total) by mouth every other day as needed for erectile dysfunction. 10 tablet 11   vitamin B-12 (CYANOCOBALAMIN) 1000 MCG tablet Take 1,000 mcg by mouth daily.     No current facility-administered medications for this visit.    Allergies: Patient has no known allergies.  Past Medical History:  Diagnosis Date   Allergy    "pollen"   Blood transfusion without reported diagnosis    with nasal sinus surgery   Cataract    BILATERAL,REMOVED   Diabetes mellitus without complication (HCC)    Fatty liver    "SOME EVIDENCE ON IMAGING EXAM"   Hyperlipidemia    Hypertension     Past Surgical History:  Procedure Laterality Date   ABDOMINAL SURGERY     scrapnel removed    CATARACTS Bilateral    COLONOSCOPY  03/17/2005   Ovidio Kin, polyp   EYE SURGERY     x 7 removed metal from eyes - shrapnel   HEMORROIDECTOMY     left shoulder surgery     NASAL SINUS SURGERY     POLYPECTOMY     right shoulder surgery      Family History  Problem Relation Age of Onset   Diabetes Sister    Heart disease Brother  Colon cancer Neg Hx    Colon polyps Neg Hx    Esophageal cancer Neg Hx    Rectal cancer Neg Hx    Stomach cancer Neg Hx    Celiac disease Neg Hx    Crohn's disease Neg Hx     Social History   Tobacco Use   Smoking status: Former    Current packs/day: 0.00    Average packs/day: 5.0 packs/day for 4.0 years (20.0 ttl pk-yrs)    Types: Cigarettes    Start date: 08/17/1982    Quit date: 08/17/1986    Years since quitting: 36.9    Passive exposure: Never   Smokeless tobacco: Never  Substance Use Topics   Alcohol use: Yes    Alcohol/week: 10.0 standard drinks of alcohol    Types: 10 Glasses of wine per week    Comment: "SEVERAL TIMES A WEEK WITH MEALS"    Subjective:   Seen on 11/7 with suspected paronychia; treated with 10 days of Doxycycline; per patient, has not seen any  improvement; concerned about persisting redness noted surrounding tip of toe/ has not seen any further drainage; was working outside yesterday and notes that toe was bleeding;   Objective:  Vitals:   07/06/23 1536  BP: (!) 142/88  Pulse: 91  Resp: 18  SpO2: 95%  Weight: 272 lb 9.6 oz (123.7 kg)  Height: 6\' 1"  (1.854 m)    General: Well developed, well nourished, in no acute distress  Skin : Warm and dry. Erythema noted at base of toe/ no drainage noted at cuticle that was seen at last OV Head: Normocephalic and atraumatic  Lungs: Respirations unlabored;  Neurologic: Alert and oriented; speech intact; face symmetrical; moves all extremities well; CNII-XII intact without focal deficit   Assessment:  1. Paronychia of great toe     Plan:  Did not respond to Doxycyline- concern for persisting infection and/ or vascular source; will update culture today and STAT X-ray to evaluate bone; will start Keflex- take as directed; STAT referral to podiatry; follow up to be determined;   No follow-ups on file.  Orders Placed This Encounter  Procedures   WOUND CULTURE    Order Specific Question:   Source    Answer:   left foot/1st toe   DG Foot Complete Left    Standing Status:   Future    Number of Occurrences:   1    Standing Expiration Date:   01/03/2024    Order Specific Question:   Reason for Exam (SYMPTOM  OR DIAGNOSIS REQUIRED)    Answer:   left foot pain/ persisting paronychia left toe    Order Specific Question:   Preferred imaging location?    Answer:   Internal   Ambulatory referral to Podiatry    Referral Priority:   Emergency    Referral Type:   Consultation    Referral Reason:   Specialty Services Required    Requested Specialty:   Podiatry    Number of Visits Requested:   1    Requested Prescriptions   Signed Prescriptions Disp Refills   cephALEXin (KEFLEX) 500 MG capsule 30 capsule 0    Sig: Take 1 capsule (500 mg total) by mouth 3 (three) times daily.

## 2023-07-08 ENCOUNTER — Encounter: Payer: Self-pay | Admitting: Podiatry

## 2023-07-08 ENCOUNTER — Ambulatory Visit: Payer: Medicare HMO | Admitting: Podiatry

## 2023-07-08 DIAGNOSIS — L03032 Cellulitis of left toe: Secondary | ICD-10-CM

## 2023-07-08 DIAGNOSIS — L02612 Cutaneous abscess of left foot: Secondary | ICD-10-CM | POA: Diagnosis not present

## 2023-07-08 DIAGNOSIS — L600 Ingrowing nail: Secondary | ICD-10-CM

## 2023-07-08 MED ORDER — SULFAMETHOXAZOLE-TRIMETHOPRIM 800-160 MG PO TABS: 1.0000 | ORAL_TABLET | Freq: Two times a day (BID) | ORAL | 0 refills | Status: AC

## 2023-07-08 NOTE — Patient Instructions (Addendum)
Mix Betadine Iodine solution with warm water. Color should look like tea.  Submerge your foot or feet in the solution and soak for 20 minutes.  This soak should be done twice a day.  Next, remove your foot or feet from solution, blot dry the affected area. Apply ointment and cover if instructed by your doctor.   IF YOUR SKIN BECOMES IRRITATED WHILE USING THESE INSTRUCTIONS, IT IS OKAY TO SWITCH TO  WHITE VINEGAR AND WATER.  As another alternative soak, you may use antibacterial soap and water.  Monitor for any signs/symptoms of infection. Call the office immediately if any occur or go directly to the emergency room. Call with any questions/concerns.

## 2023-07-09 LAB — WOUND CULTURE
MICRO NUMBER:: 15750563
SPECIMEN QUALITY:: ADEQUATE

## 2023-07-09 NOTE — Progress Notes (Signed)
Chief Complaint  Patient presents with   Nail Problem    Left great toenail is ingrown and infected. He had taken 10 days of doxycycline which didn't help.  He has been on Cephalexin 500- tid for 2 days so far.   Has had the problem a year ago that got better and now has flared back up.  Droppped a piece of metal on it about a year ago as well.  Sensitive to the touch from the side.  Pooled up blood around the toe- no pus.  XRAYS taken on 07/06/23.    Ankle Pain    Hit his ankle against his wifes soaking tub-  wanted you to check to be sure that looks OK.      HPI: 72 y.o. male presents today with painful ingrown toenail to the left hallux medial border.  He has associated cellulitis with this.  He states that this began about a month ago and is worsened over time.  He denies any recent trauma to the toe.  He does endorse remote history of dropping a piece of wood or metal in the toe years ago.  He had previously taken 10 days of doxycycline which did not improve the redness or swelling, he has been on Keflex for 2 days so far prescribed by his primary care physician.  Patient is diabetic, last A1c 7.2.  He also endorses history of gout.  Patient denies any nausea, vomiting, fever, chills, chest pain, shortness of breath.  Past Medical History:  Diagnosis Date   Allergy    "pollen"   Blood transfusion without reported diagnosis    with nasal sinus surgery   Cataract    BILATERAL,REMOVED   Diabetes mellitus without complication (HCC)    Fatty liver    "SOME EVIDENCE ON IMAGING EXAM"   Hyperlipidemia    Hypertension     Past Surgical History:  Procedure Laterality Date   ABDOMINAL SURGERY     scrapnel removed    CATARACTS Bilateral    COLONOSCOPY  03/17/2005   Ovidio Kin, polyp   EYE SURGERY     x 7 removed metal from eyes - shrapnel   HEMORROIDECTOMY     left shoulder surgery     NASAL SINUS SURGERY     POLYPECTOMY     right shoulder surgery      No Known  Allergies  ROS    Physical Exam: There were no vitals filed for this visit.  General: The patient is alert and oriented x3 in no acute distress.  Dermatology: Skin is warm, dry and supple bilateral lower extremities.  Left hallux is erythematous and edematous, no fluctuance appreciated.  No drainage appreciated.  The medial border of the left hallux nail is tender on palpation.  No tenderness to the base or lateral aspects.  Vascular: Palpable pedal pulses bilaterally. Capillary refill within normal limits.  Diminished pedal hair growth.  No cyanosis or pallor to the digits.  Neurological: Protective sensation diminished bilaterally.  Musculoskeletal Exam: Muscle strength 5/5.  Mild left great toe mallet toe.  Radiographic Exam: 07/06/2023 -I reviewed outside images which demonstrate arthritic changes to the first MPJ and hallucal IPJ.  Some evidence of old fracture appreciated here.  There may be questionable cortical erosion to the distal medial aspect of the distal phalanx however this may be artifact.  No soft tissue edema.  No definitive osteomyelitis.   Assessment/Plan of Care: 1. Ingrown toenail of left foot  2. Cellulitis and abscess of toe of left foot      Meds ordered this encounter  Medications   sulfamethoxazole-trimethoprim (BACTRIM DS) 800-160 MG tablet    Sig: Take 1 tablet by mouth 2 (two) times daily for 10 days.    Dispense:  20 tablet    Refill:  0   None  Discussed clinical findings with patient today.  Plan: -Written and verbal consent obtained to perform left hallux medial border nail avulsion after discussion of risk, benefits and alternative therapies. -Left first toe was injected with 3 cc of a one-to-one ratio of 2% lidocaine plain 0.5% Marcaine plain in a local fashion. -Left first toe was prepped with Betadine.  Brian [name] was used to free up the medial portion of the nail.  Aseptic nail nipper was used to resect the medial portion of the nail down to  the matrix.  The resected nail was then removed with a hemostat. -No purulence was appreciated.  The site was irrigated with isopropyl alcohol. -Silvadene bandage was applied with Coban.  Minimal blood loss. -We will start patient on 10 days of Bactrim for cellulitis. -Soaking in aftercare instructions discussed with patient at length and written form was provided. -Patient to follow-up with me in 2 weeks for nail check and to assess progression of cellulitis.  Discussed signs and symptoms of worsening infection and instructed the patient to call the office or seek medical care if these should occur.   Brian [name] L. Marchia Bond, AACFAS [facility]     [address] [place], [place] [address]                Office [phone]  Fax [phone]

## 2023-07-13 ENCOUNTER — Ambulatory Visit: Payer: Medicare HMO | Admitting: Podiatry

## 2023-07-22 ENCOUNTER — Encounter: Payer: Self-pay | Admitting: Podiatry

## 2023-07-22 ENCOUNTER — Ambulatory Visit: Payer: Medicare HMO | Admitting: Podiatry

## 2023-07-22 DIAGNOSIS — L600 Ingrowing nail: Secondary | ICD-10-CM

## 2023-07-22 DIAGNOSIS — L02612 Cutaneous abscess of left foot: Secondary | ICD-10-CM | POA: Diagnosis not present

## 2023-07-22 DIAGNOSIS — E1140 Type 2 diabetes mellitus with diabetic neuropathy, unspecified: Secondary | ICD-10-CM

## 2023-07-22 DIAGNOSIS — L03032 Cellulitis of left toe: Secondary | ICD-10-CM | POA: Diagnosis not present

## 2023-07-22 MED ORDER — SULFAMETHOXAZOLE-TRIMETHOPRIM 800-160 MG PO TABS: 1.0000 | ORAL_TABLET | Freq: Two times a day (BID) | ORAL | 0 refills | Status: AC

## 2023-07-22 NOTE — Progress Notes (Signed)
Chief Complaint  Patient presents with   Foot Pain    Follow-up.  Still red, finished antibiotic.    HPI: 71 y.o. male status post right hallux medial border nail avulsion on 11/21.  Patient completed course of oral Bactrim.  Denies any pain to the toe.  States redness and swelling have improved overall however there are still some residual changes present.  Patient is diabetic, last A1c 7.2.  He also endorses history of gout.  Patient denies any nausea, vomiting, fever, chills, chest pain, shortness of breath.  Past Medical History:  Diagnosis Date   Allergy    "pollen"   Blood transfusion without reported diagnosis    with nasal sinus surgery   Cataract    BILATERAL,REMOVED   Diabetes mellitus without complication (HCC)    Fatty liver    "SOME EVIDENCE ON IMAGING EXAM"   Hyperlipidemia    Hypertension     Past Surgical History:  Procedure Laterality Date   ABDOMINAL SURGERY     scrapnel removed    CATARACTS Bilateral    COLONOSCOPY  03/17/2005   Ovidio Kin, polyp   EYE SURGERY     x 7 removed metal from eyes - shrapnel   HEMORROIDECTOMY     left shoulder surgery     NASAL SINUS SURGERY     POLYPECTOMY     right shoulder surgery      No Known Allergies  ROS    Physical Exam: There were no vitals filed for this visit.  General: The patient is alert and oriented x3 in no acute distress.  Dermatology: Skin is warm, dry and supple bilateral lower extremities.  Left hallux mildly edematous, mild residual erythema present, no fluctuance appreciated.  No drainage appreciated.  The medial border of the left hallux nail is healing well.  No tenderness on palpation  Vascular: Palpable pedal pulses bilaterally. Capillary refill within normal limits.  Diminished pedal hair growth.  No cyanosis or pallor to the digits.  Neurological: Protective sensation diminished bilaterally.  Musculoskeletal Exam: Muscle strength 5/5.  Mild left great toe mallet toe.   Hammertoe contractures noted  Radiographic Exam: 07/06/2023 -I reviewed outside images which demonstrate arthritic changes to the first MPJ and hallucal IPJ.  Some evidence of old fracture appreciated here.  There may be questionable cortical erosion to the distal medial aspect of the distal phalanx however this may be artifact.  No soft tissue edema.  No definitive osteomyelitis.   Assessment/Plan of Care: 1. Ingrown toenail of left foot   2. Cellulitis and abscess of toe of left foot   3. Type 2 diabetes, controlled, with neuropathy (HCC)      Meds ordered this encounter  Medications   sulfamethoxazole-trimethoprim (BACTRIM DS) 800-160 MG tablet    Sig: Take 1 tablet by mouth 2 (two) times daily for 10 days.    Dispense:  20 tablet    Refill:  0   None  Discussed clinical findings with patient today.  Plan: - Significant improvement to the left great toe, patient denies any pain today though there is still some residual redness and swelling - Extend course of oral Bactrim out of an abundance of caution due to some residual redness and swelling. -Discussed with patient I expect this to fully resolve, patient may follow-up in 2 weeks if there are any residual signs or symptoms of possible infection, follow-up x-rays at this time if necessary. -16-month follow-up for diabetic foot care was  offered to the patient.  Discussed diabetic foot education and home care instructions including daily foot checks and avoiding barefoot ambulation.   James Lafalce L. Marchia Bond, AACFAS [facility]     [address] [place], [place] [address]                Office [phone]  Fax [phone]

## 2023-08-05 ENCOUNTER — Ambulatory Visit: Payer: Medicare HMO | Admitting: Podiatry

## 2023-08-05 ENCOUNTER — Other Ambulatory Visit: Payer: Self-pay | Admitting: Family

## 2023-08-06 ENCOUNTER — Other Ambulatory Visit: Payer: Self-pay | Admitting: Family

## 2023-08-25 ENCOUNTER — Other Ambulatory Visit: Payer: Self-pay | Admitting: Family

## 2023-09-25 ENCOUNTER — Other Ambulatory Visit: Payer: Self-pay | Admitting: Family

## 2023-10-14 ENCOUNTER — Encounter: Payer: Self-pay | Admitting: Family

## 2023-10-14 ENCOUNTER — Ambulatory Visit (INDEPENDENT_AMBULATORY_CARE_PROVIDER_SITE_OTHER): Payer: Medicare HMO | Admitting: Family

## 2023-10-14 VITALS — BP 138/74 | HR 88 | Ht 73.0 in | Wt 279.8 lb

## 2023-10-14 DIAGNOSIS — E1140 Type 2 diabetes mellitus with diabetic neuropathy, unspecified: Secondary | ICD-10-CM | POA: Diagnosis not present

## 2023-10-14 DIAGNOSIS — R7989 Other specified abnormal findings of blood chemistry: Secondary | ICD-10-CM | POA: Diagnosis not present

## 2023-10-14 DIAGNOSIS — N529 Male erectile dysfunction, unspecified: Secondary | ICD-10-CM

## 2023-10-14 DIAGNOSIS — R2689 Other abnormalities of gait and mobility: Secondary | ICD-10-CM

## 2023-10-14 DIAGNOSIS — M545 Low back pain, unspecified: Secondary | ICD-10-CM | POA: Diagnosis not present

## 2023-10-14 DIAGNOSIS — E119 Type 2 diabetes mellitus without complications: Secondary | ICD-10-CM

## 2023-10-14 DIAGNOSIS — Z125 Encounter for screening for malignant neoplasm of prostate: Secondary | ICD-10-CM | POA: Diagnosis not present

## 2023-10-14 DIAGNOSIS — G8929 Other chronic pain: Secondary | ICD-10-CM

## 2023-10-14 DIAGNOSIS — Z7984 Long term (current) use of oral hypoglycemic drugs: Secondary | ICD-10-CM | POA: Diagnosis not present

## 2023-10-14 DIAGNOSIS — R293 Abnormal posture: Secondary | ICD-10-CM

## 2023-10-14 LAB — COMPREHENSIVE METABOLIC PANEL
ALT: 33 U/L (ref 0–53)
AST: 37 U/L (ref 0–37)
Albumin: 4.3 g/dL (ref 3.5–5.2)
Alkaline Phosphatase: 74 U/L (ref 39–117)
BUN: 19 mg/dL (ref 6–23)
CO2: 28 meq/L (ref 19–32)
Calcium: 9.4 mg/dL (ref 8.4–10.5)
Chloride: 95 meq/L — ABNORMAL LOW (ref 96–112)
Creatinine, Ser: 0.96 mg/dL (ref 0.40–1.50)
GFR: 77.77 mL/min (ref >60.00)
Glucose, Bld: 163 mg/dL — ABNORMAL HIGH (ref 70–99)
Potassium: 4.4 meq/L (ref 3.5–5.1)
Sodium: 131 meq/L — ABNORMAL LOW (ref 135–145)
Total Bilirubin: 1.4 mg/dL — ABNORMAL HIGH (ref 0.2–1.2)
Total Protein: 7.2 g/dL (ref 6.0–8.3)

## 2023-10-14 LAB — CBC WITH DIFFERENTIAL/PLATELET
Basophils Absolute: 0 10*3/uL (ref 0.0–0.1)
Basophils Relative: 0.3 % (ref 0.0–3.0)
Eosinophils Absolute: 0.3 10*3/uL (ref 0.0–0.7)
Eosinophils Relative: 3.5 % (ref 0.0–5.0)
HCT: 43 % (ref 39.0–52.0)
Hemoglobin: 14.5 g/dL (ref 13.0–17.0)
Lymphocytes Relative: 10.3 % — ABNORMAL LOW (ref 12.0–46.0)
Lymphs Abs: 0.9 10*3/uL (ref 0.7–4.0)
MCHC: 33.8 g/dL (ref 30.0–36.0)
MCV: 96.7 fl (ref 78.0–100.0)
Monocytes Absolute: 0.9 10*3/uL (ref 0.1–1.0)
Monocytes Relative: 10.8 % (ref 3.0–12.0)
Neutro Abs: 6.2 10*3/uL (ref 1.4–7.7)
Neutrophils Relative %: 75.1 % (ref 43.0–77.0)
Platelets: 272 10*3/uL (ref 150.0–400.0)
RBC: 4.44 Mil/uL (ref 4.22–5.81)
RDW: 13.1 % (ref 11.5–15.5)
WBC: 8.3 10*3/uL (ref 4.0–10.5)

## 2023-10-14 LAB — VITAMIN B12: Vitamin B-12: 733 pg/mL (ref 211–911)

## 2023-10-14 LAB — PSA: PSA: 2.08 ng/mL (ref 0.10–4.00)

## 2023-10-14 LAB — MICROALBUMIN / CREATININE URINE RATIO
Creatinine,U: 110.9 mg/dL
Microalb Creat Ratio: 134.8 mg/g — ABNORMAL HIGH (ref 0.0–30.0)
Microalb, Ur: 14.9 mg/dL — ABNORMAL HIGH (ref 0.0–1.9)

## 2023-10-14 LAB — HEMOGLOBIN A1C: Hgb A1c MFr Bld: 6.9 % — ABNORMAL HIGH (ref 4.6–6.5)

## 2023-10-14 MED ORDER — SILDENAFIL CITRATE 20 MG PO TABS: ORAL_TABLET | ORAL | 0 refills | Status: DC

## 2023-10-14 MED ORDER — GABAPENTIN 100 MG PO CAPS: ORAL_CAPSULE | ORAL | 1 refills | Status: DC

## 2023-10-14 MED ORDER — METFORMIN HCL 1000 MG PO TABS: 1000.0000 mg | ORAL_TABLET | Freq: Two times a day (BID) | ORAL | 1 refills | Status: DC

## 2023-10-14 MED ORDER — LISINOPRIL 2.5 MG PO TABS: 2.5000 mg | ORAL_TABLET | Freq: Two times a day (BID) | ORAL | 1 refills | Status: DC

## 2023-10-14 NOTE — Patient Instructions (Signed)
 Regarding the Gabapentin-  It is fine to take 1 tablet in the am/ 1 in the afternoon and 2 in the evening; the prescription has been adjusted accordingly;

## 2023-10-14 NOTE — Progress Notes (Signed)
 Brian [name] is a 73 y.o. male with the following history as recorded in [employer]:  Patient Active Problem List   Diagnosis Date Noted   COVID-19 03/17/2021   Toe injury, left, initial encounter 06/12/2019   DYSPNEA ON EXERTION 07/27/2008   ELECTROCARDIOGRAM, ABNORMAL 07/27/2008   DIABETES-TYPE 2 05/07/2008   Hyperlipidemia 05/07/2008   ED (erectile dysfunction) 05/07/2008   HYPERTENSION, BENIGN ESSENTIAL 05/07/2008    Current Outpatient Medications  Medication Sig Dispense Refill   allopurinol (ZYLOPRIM) 300 MG tablet Take 1 tablet by mouth once daily 90 tablet 3   aspirin EC 81 MG tablet Take 81 mg by mouth daily. Swallow whole.     atorvastatin (LIPITOR) 20 MG tablet Take 1 tablet by mouth once daily 90 tablet 0   Azelastine HCl 137 MCG/SPRAY SOLN Place 2 sprays into both nostrils 2 (two) times daily as needed. 90 mL 1   fluticasone (FLONASE) 50 MCG/ACT nasal spray Place 2 sprays into both nostrils daily. 16 g 5   glimepiride (AMARYL) 2 MG tablet Take 1 tablet by mouth twice daily 180 tablet 0   hydrochlorothiazide (HYDRODIURIL) 25 MG tablet Take 1 tablet (25 mg total) by mouth daily. 90 tablet 1   levocetirizine (XYZAL) 5 MG tablet Take 1 tablet (5 mg total) by mouth every evening. 90 tablet 1   Multiple Vitamin (MULTIVITAMIN WITH MINERALS) TABS tablet Take 1 tablet by mouth daily.     OVER THE COUNTER MEDICATION CLEAR EYES FOR REDNESS AS NEEDED     sildenafil (REVATIO) 20 MG tablet Take 3-5 tablets as directed prior to intercourse 50 tablet 0   vitamin B-12 (CYANOCOBALAMIN) 1000 MCG tablet Take 1,000 mcg by mouth daily.     gabapentin (NEURONTIN) 100 MG capsule TAKE 1 CAPSULE BY MOUTH DAILY IN THE MORNING, 1 tablet in the afternoon AND 2 CAPSULES DAILY IN THE EVENING 360 capsule 1   lisinopril (ZESTRIL) 2.5 MG tablet Take 1 tablet (2.5 mg total) by mouth 2 (two) times daily. 180 tablet 1   metFORMIN (GLUCOPHAGE) 1000 MG tablet Take 1 tablet (1,000 mg total) by mouth 2  (two) times daily with a meal. 180 tablet 1   No current facility-administered medications for this visit.    Allergies: Patient has no known allergies.  Past Medical History:  Diagnosis Date   Allergy    "pollen"   Blood transfusion without reported diagnosis    with nasal sinus surgery   Cataract    BILATERAL,REMOVED   Diabetes mellitus without complication (HCC)    Fatty liver    "SOME EVIDENCE ON IMAGING EXAM"   Hyperlipidemia    Hypertension     Past Surgical History:  Procedure Laterality Date   ABDOMINAL SURGERY     scrapnel removed    CATARACTS Bilateral    COLONOSCOPY  03/17/2005   Ovidio Kin, polyp   EYE SURGERY     x 7 removed metal from eyes - shrapnel   HEMORROIDECTOMY     left shoulder surgery     NASAL SINUS SURGERY     POLYPECTOMY     right shoulder surgery      Family History  Problem Relation Age of Onset   Diabetes Sister    Heart disease Brother    Colon cancer Neg Hx    Colon polyps Neg Hx    Esophageal cancer Neg Hx    Rectal cancer Neg Hx    Stomach cancer Neg Hx    Celiac disease Neg Hx  Crohn's disease Neg Hx     Social History   Tobacco Use   Smoking status: Former    Current packs/day: 0.00    Average packs/day: 5.0 packs/day for 4.0 years (20.0 ttl pk-yrs)    Types: Cigarettes    Start date: 08/17/1982    Quit date: 08/17/1986    Years since quitting: 37.1    Passive exposure: Never   Smokeless tobacco: Never  Substance Use Topics   Alcohol use: Yes    Alcohol/week: 10.0 standard drinks of alcohol    Types: 10 Glasses of wine per week    Comment: "SEVERAL TIMES A WEEK WITH MEALS"    Subjective:   6 month follow up on chronic care needs- does feel that neuropathy is worsening/ has been cleared by podiatry for recurrent infection; concerned about persisting low back pain/ balance issues/ poor posture- would be open to discussing PT;   Objective:  Vitals:   10/14/23 0904  BP: 138/74  Pulse: 88  SpO2: 94%  Weight: 279  lb 12.8 oz (126.9 kg)  Height: 6\' 1"  (1.854 m)    General: Well developed, well nourished, in no acute distress  Skin : Warm and dry.  Head: Normocephalic and atraumatic  Eyes: Sclera and conjunctiva clear; pupils round and reactive to light; extraocular movements intact  Ears: External normal; canals clear; tympanic membranes normal  Oropharynx: Pink, supple. No suspicious lesions  Neck: Supple without thyromegaly, adenopathy  Lungs: Respirations unlabored; clear to auscultation bilaterally without wheeze, rales, rhonchi  CVS exam: normal rate and regular rhythm.  Abdomen: Soft; nontender; nondistended; normoactive bowel sounds; no masses or hepatosplenomegaly  Musculoskeletal: No deformities; no active joint inflammation  Extremities: No edema, cyanosis, clubbing  Vessels: Symmetric bilaterally  Neurologic: Alert and oriented; speech intact; face symmetrical; moves all extremities well; CNII-XII intact without focal deficit   Assessment:  1. Type 2 diabetes mellitus with diabetic neuropathy, unspecified whether long term insulin use (HCC)   2. Diabetes mellitus treated with oral medication (HCC)   3. Prostate cancer screening   4. Low vitamin B12 level   5. Chronic low back pain without sciatica, unspecified back pain laterality   6. Balance problem   7. Poor posture   8. Erectile dysfunction, unspecified erectile dysfunction type     Plan:  Will update labs today; referral to PT as discussed; Discussed options for taking his Gabapentin to help with neuropathy- to consider vascular consult if symptoms continue to worsen; discussed sooner follow up in 3 months but patient prefers to keep at 6 and agrees to follow up sooner if needed;  Patient asked to go back to Sildenafil which he has used successfully in the past- discussed the generic Revatio as option;   Time spent 30 minutes  No follow-ups on file.  Orders Placed This Encounter  Procedures   Comp Met (CMET)   Hemoglobin  A1c   B12   PSA   CBC with Differential/Platelet   Urine Microalbumin w/creat. ratio   Ambulatory referral to Physical Therapy    Referral Priority:   Routine    Referral Type:   Physical Medicine    Referral Reason:   Specialty Services Required    Requested Specialty:   Physical Therapy    Number of Visits Requested:   1    Requested Prescriptions   Signed Prescriptions Disp Refills   gabapentin (NEURONTIN) 100 MG capsule 360 capsule 1    Sig: TAKE 1 CAPSULE BY MOUTH DAILY IN THE MORNING,  1 tablet in the afternoon AND 2 CAPSULES DAILY IN THE EVENING   sildenafil (REVATIO) 20 MG tablet 50 tablet 0    Sig: Take 3-5 tablets as directed prior to intercourse   metFORMIN (GLUCOPHAGE) 1000 MG tablet 180 tablet 1    Sig: Take 1 tablet (1,000 mg total) by mouth 2 (two) times daily with a meal.   lisinopril (ZESTRIL) 2.5 MG tablet 180 tablet 1    Sig: Take 1 tablet (2.5 mg total) by mouth 2 (two) times daily.

## 2023-10-23 ENCOUNTER — Other Ambulatory Visit: Payer: Self-pay | Admitting: Family

## 2023-10-25 ENCOUNTER — Other Ambulatory Visit: Payer: Self-pay | Admitting: Family

## 2023-10-25 MED ORDER — LISINOPRIL 5 MG PO TABS: 5.0000 mg | ORAL_TABLET | Freq: Two times a day (BID) | ORAL | 1 refills | Status: DC

## 2023-10-27 NOTE — Therapy (Signed)
 OUTPATIENT PHYSICAL THERAPY EVALUATION   Patient Name: Brian [name] MRN: [id number] DOB:1950/09/22, 73 y.o., male Today's Date: 11/01/2023   END OF SESSION:  PT End of Session - 11/01/23 0848     Visit Number 1    Date for PT Re-Evaluation 12/27/23    Authorization Type [employer]    Progress Note Due on Visit 10    PT Start Time 0848    PT Stop Time 0933    PT Time Calculation (min) 45 min    Activity Tolerance Patient tolerated treatment well    Behavior During Therapy WFL for tasks assessed/performed             Past Medical History:  Diagnosis Date   Allergy    "pollen"   Blood transfusion without reported diagnosis    with nasal sinus surgery   Cataract    BILATERAL,REMOVED   Diabetes mellitus without complication (HCC)    Fatty liver    "SOME EVIDENCE ON IMAGING EXAM"   Hyperlipidemia    Hypertension    Past Surgical History:  Procedure Laterality Date   ABDOMINAL SURGERY     scrapnel removed    CATARACTS Bilateral    COLONOSCOPY  03/17/2005   Ovidio Kin, polyp   EYE SURGERY     x 7 removed metal from eyes - shrapnel   HEMORROIDECTOMY     left shoulder surgery     NASAL SINUS SURGERY     POLYPECTOMY     right shoulder surgery     Patient Active Problem List   Diagnosis Date Noted   COVID-19 03/17/2021   Toe injury, left, initial encounter 06/12/2019   DYSPNEA ON EXERTION 07/27/2008   ELECTROCARDIOGRAM, ABNORMAL 07/27/2008   DIABETES-TYPE 2 05/07/2008   Hyperlipidemia 05/07/2008   ED (erectile dysfunction) 05/07/2008   HYPERTENSION, BENIGN ESSENTIAL 05/07/2008    PCP: Olive Bass, FNP   REFERRING PROVIDER: Olive Bass, FNP  REFERRING DIAG:  [phone] (ICD-10-CM) - Chronic low back pain without sciatica, unspecified back pain laterality  R26.89 (ICD-10-CM) - Balance problem  R29.3 (ICD-10-CM) - Poor posture   THERAPY DIAG:  Unsteadiness on feet  Abnormal posture  Muscle weakness  (generalized)  Other low back pain  RATIONALE FOR EVALUATION AND TREATMENT: Rehabilitation  ONSET DATE: ~4 yrs  NEXT MD VISIT: 04/11/24   SUBJECTIVE:  SUBJECTIVE STATEMENT: Pt reports he finds himself stooping more and more. Feels top heavy and finds himself tripping easily. Also notes more limited endurance - can walk as far as he used to.  Fwd posture contributes to increased back pain.   Pt accompanied by: self  PAIN: Are you having pain? Yes: NPRS scale: 2/10 currently, at worst 4/10  Pain location: B lower back  Pain description: stiff, sore  Aggravating factors: working in the garden, stairs/steps  Relieving factors: sleep, sitting erect, heating pad   PERTINENT HISTORY:  DM-II, diabetic peripheral neuropathy, HTN, HLD, B shoulder surgery  PRECAUTIONS: Fall  RED FLAGS: None  WEIGHT BEARING RESTRICTIONS: No  FALLS:  Has patient fallen in last 6 months? Yes. Number of falls 1  LIVING ENVIRONMENT: Lives with: lives with their spouse Lives in: House/apartment Stairs: Yes: Internal: 14 steps; on right going up and on left going up and External: 4 steps; on right going up, on left going up, and can reach both Has following equipment at home:  elevated commode, walk-in shower  OCCUPATION: Retired  PLOF: Independent and Leisure: gardening - greenhouse, yard work, reading   PATIENT GOALS: "Better posture."   OBJECTIVE: (objective measures completed at initial evaluation unless otherwise dated)  DIAGNOSTIC FINDINGS:  N/A  COGNITION: Overall cognitive status: Within functional limits for tasks assessed   SENSATION: WFL Except diabetic neuropathy in B feet  POSTURE:  rounded shoulders, forward head, decreased lumbar lordosis, increased thoracic kyphosis, and flexed  trunk   PALPATION: Increased muscle tension/tightness in lumbar paraspinals, glutes, piriformis and hip flexors with TTP in upper glutes   LUMBAR ROM:   Active  Eval  Flexion WFL  Extension 75% limited  Right lateral flexion WFL  Left lateral flexion WFL  Right rotation WFL  Left rotation WFL   (Blank rows = not tested)   MUSCLE LENGTH: Hamstrings: mild tight B ITB: mod tight B Piriformis: severe tight B Hip flexors: severe tight B Quads: mod/severe tight B Heelcord:   LOWER EXTREMITY ROM:    Limited B hip IR>ER  LOWER EXTREMITY MMT:    MMT Right eval Left eval  Hip flexion 4+ 4+  Hip extension 4- * 4- *  Hip abduction 4- 4-  Hip adduction 4 4  Hip internal rotation 4+ *  4+ *  Hip external rotation 4+ 4+  Knee flexion 5 5  Knee extension 5 5  Ankle dorsiflexion 4+ 4  Ankle plantarflexion    Ankle inversion    Ankle eversion    (Blank rows = not tested, * - for available ROM)  BED MOBILITY:  Mod independent  TRANSFERS: Assistive device utilized: None  Sit to stand: Complete Independence Stand to sit: Complete Independence Chair to chair:    Floor:     GAIT: Distance walked: clinic distances Assistive device utilized: None Level of assistance: Complete Independence Gait pattern: decreased stride length and trunk flexed Comments:   FUNCTIONAL TESTS: (Remaining tests to be assessed next visit) 5 times sit to stand: 16.41 sec w/o UE assist - tendency for shift to R LE Timed up and go (TUG): 10.16 sec 10 meter walk test: 9.81 sec; Gait speed = 3.34 ft/sec Berg Balance Scale:   Functional gait assessment:    PATIENT SURVEYS:  ABC scale 1280 / 1600 = 80.0 %, moderate level of physical functioning  Modified Oswestry 13 / 50 = 26.0 %, moderate disability     TODAY'S TREATMENT:   11/01/2023  SELF CARE:  Reviewed eval  findings and role of PT in addressing identified deficits as well as instruction in initial HEP (see below).    PATIENT EDUCATION:   Education details: PT eval findings, anticipated POC, need for further assessment of static and dynamic standing balance, and initial HEP  Person educated: Patient Education method: Explanation, Demonstration, Verbal cues, and Handouts Education comprehension: verbalized understanding, returned demonstration, verbal cues required, and needs further education  HOME EXERCISE PROGRAM: Access Code: [email or web address] URL: [email or web address] Date: 11/01/2023 Prepared by: Glenetta Hew  Exercises - Supine Piriformis Stretch with Foot on Ground  - 2 x daily - 7 x weekly - 3 reps - 30 sec hold - Supine Single Knee to Chest Stretch  - 2 x daily - 7 x weekly - 3 reps - 30 sec hold - Hip Flexor Stretch at Edge of Bed  - 2 x daily - 7 x weekly - 3 reps - 30 sec hold - Standing Hip Flexor Stretch  - 2 x daily - 7 x weekly - 3 reps - 30 sec hold - Standing Lumbar Extension at Wall - Forearms  - 2 x daily - 7 x weekly - 2 sets - 10 reps - 3 sec hold   ASSESSMENT:  CLINICAL IMPRESSION: Myrtle Paul Trettin is a 73 y.o. male who was referred to physical therapy for evaluation and treatment for balance impairment, poor posture and chronic LBP.  He reports only 1 fall in the past 6 months but admits to multiple "trips" and "stumbles". Patient presents with physical impairments of impaired activity tolerance, impaired standing balance, impaired ambulation, and decreased safety awareness impacting safe and independent functional mobility.  Examination revealed patient is at risk for falls and functional decline as evidenced by the following objective test measures: Gait speed 3.34 ft/sec, (4.37 ft/sec is considered normal walking speed) and 5xSTS of 16.41 sec (>15 sec indicates increased risk for falls and decreased BLE power).  TUG time of 10.16 sec is WFL (>13.5 sec indicates increased risk for falls), however patient will benefit from further balance assessment with Berg and FGA  to determine potential  static and dynamic balance deficits which may contribute to his risk for falls.  Colbey "Mellody Dance" will benefit from skilled PT to address above deficits to improve mobility and activity tolerance to help reach the maximal level of functional independence and mobility. Patient demonstrates understanding of this POC and is in agreement with this plan.   OBJECTIVE IMPAIRMENTS: Abnormal gait, decreased activity tolerance, decreased balance, decreased coordination, decreased endurance, decreased knowledge of condition, decreased mobility, difficulty walking, decreased ROM, decreased strength, decreased safety awareness, hypomobility, increased fascial restrictions, impaired perceived functional ability, increased muscle spasms, impaired flexibility, improper body mechanics, postural dysfunction, and pain.   ACTIVITY LIMITATIONS: carrying, lifting, bending, standing, squatting, stairs, transfers, reach over head, and locomotion level  PARTICIPATION LIMITATIONS: meal prep, cleaning, laundry, community activity, and yard work  PERSONAL FACTORS: Age, Fitness, Past/current experiences, Time since onset of injury/illness/exacerbation, and 3+ comorbidities: DM-II, diabetic peripheral neuropathy, HTN, HLD, B shoulder surgery  are also affecting patient's functional outcome.   REHAB POTENTIAL: Good  CLINICAL DECISION MAKING: Evolving/moderate complexity  EVALUATION COMPLEXITY: Moderate   GOALS: Goals reviewed with patient? Yes  SHORT TERM GOALS: Target date: 11/29/2023  Patient will be independent with initial HEP to improve outcomes and carryover.  Baseline:  Goal status: INITIAL  2.  Patient will be educated on strategies to decrease risk of falls.  Baseline:  Goal status: INITIAL  3.  Patient will demonstrate decreased  5xSTS time to </= 12.6 sec to decrease risk for falls with transitional mobility. Baseline: 16.41 sec Goal status: INITIAL  LONG TERM GOALS: Target date: 12/27/2023  Patient will  be independent with advanced/ongoing HEP to facilitate ability to maintain/progress functional gains from skilled physical therapy services. Baseline:  Goal status: INITIAL  2.  Patient will be able to ambulate 600' w/o LRAD on variable surfaces with good safety to access community.  Baseline:  Goal status: INITIAL  3.  Patient will be able to step up/down curb safely with LRAD for safety with community ambulation.  Baseline:  Goal status: INITIAL   4.  Patient will demonstrate improved proximal B LE strength to >/= 4+/5 for improved stability and ease of mobility . Baseline: Refer to above LE MMT table Goal status: INITIAL  5.  Patient will improve Berg score by at least 8 points or to >/= 52/56 to improve safety and stability with ADLs in standing and reduce risk for falls. (MCID= 8 points). Baseline: TBA Goal status: INITIAL   6.  Patient will improve FGA score by at least 4 points or to at least 19/30 to improve gait stability and reduce risk for falls.  Baseline: TBA Goal status: INITIAL  7.   Patient will report </= 14% on Modified Oswestry to demonstrate improved function with decreased pain interference. Baseline: 13 / 50 = 26.0 % Goal status: INITIAL  8. Patient will report >/= 90% on ABC scale to demonstrate improved balance confidence with functional mobility and gait. Baseline: 1280 / 1600 = 80.0 % Goal status: INITIAL  9.  Patient to demonstrate ability to achieve and maintain good spinal alignment/posturing and body mechanics needed for daily activities. Baseline: Fwd flexed, fwd head and rounded shoulder posture Goal status: INITIAL   PLAN:  PT FREQUENCY: 2x/week  PT DURATION: 8 weeks  PLANNED INTERVENTIONS: 97164- PT Re-evaluation, 97110-Therapeutic exercises, 97530- Therapeutic activity, 97112- Neuromuscular re-education, 97535- Self Care, 96045- Manual therapy, [phone]- Gait training, [phone]- Aquatic Therapy, [phone]- Electrical stimulation (unattended), 97035-  Ultrasound, 21308- Traction (mechanical), Patient/Family education, Balance training, Stair training, Taping, Dry Needling, Joint mobilization, Spinal mobilization, Cryotherapy, Moist heat, and 97750- Physical Performance Test or Measurement   PLAN FOR NEXT SESSION: Complete balance assessment - Berg & FGA; review & modify HEP if needed; progress lumbar extension ROM and proximal LE flexibility, esp hip flexors; lumbopelvic strengthening with glute emphasis; MT +/- TPDN to address abnormal muscle tension/tightness   Marry Guan, PT 11/01/2023, 6:47 PM

## 2023-10-30 ENCOUNTER — Other Ambulatory Visit: Payer: Self-pay | Admitting: Family

## 2023-11-01 ENCOUNTER — Ambulatory Visit: Payer: Medicare HMO | Attending: Family | Admitting: Physical Therapy

## 2023-11-01 ENCOUNTER — Other Ambulatory Visit: Payer: Self-pay

## 2023-11-01 ENCOUNTER — Telehealth: Payer: Self-pay

## 2023-11-01 ENCOUNTER — Encounter: Payer: Self-pay | Admitting: Physical Therapy

## 2023-11-01 DIAGNOSIS — R2689 Other abnormalities of gait and mobility: Secondary | ICD-10-CM | POA: Insufficient documentation

## 2023-11-01 DIAGNOSIS — M5459 Other low back pain: Secondary | ICD-10-CM | POA: Insufficient documentation

## 2023-11-01 DIAGNOSIS — R2681 Unsteadiness on feet: Secondary | ICD-10-CM | POA: Diagnosis not present

## 2023-11-01 DIAGNOSIS — M545 Low back pain, unspecified: Secondary | ICD-10-CM | POA: Insufficient documentation

## 2023-11-01 DIAGNOSIS — R293 Abnormal posture: Secondary | ICD-10-CM | POA: Diagnosis not present

## 2023-11-01 DIAGNOSIS — G8929 Other chronic pain: Secondary | ICD-10-CM | POA: Insufficient documentation

## 2023-11-01 DIAGNOSIS — M6281 Muscle weakness (generalized): Secondary | ICD-10-CM | POA: Insufficient documentation

## 2023-11-01 NOTE — Telephone Encounter (Signed)
 Copied from CRM [id number]. Topic: Clinical - Lab/Test Results >> Nov 01, 2023  4:21 PM Brian [name] wrote: Reason for CRM: Patient would like to speak with the nurse in regard to the letter he received  with the software error and his kidney function - he is concerned about his results.

## 2023-11-02 ENCOUNTER — Other Ambulatory Visit: Payer: Self-pay | Admitting: Family

## 2023-11-02 NOTE — Telephone Encounter (Signed)
 Called pt and left a VM advising pt. Also advised pt to give the office a call back with any further questions or concerns.

## 2023-11-08 ENCOUNTER — Ambulatory Visit: Admitting: Physical Therapy

## 2023-11-08 ENCOUNTER — Encounter: Payer: Self-pay | Admitting: Physical Therapy

## 2023-11-08 DIAGNOSIS — M6281 Muscle weakness (generalized): Secondary | ICD-10-CM

## 2023-11-08 DIAGNOSIS — R293 Abnormal posture: Secondary | ICD-10-CM | POA: Diagnosis not present

## 2023-11-08 DIAGNOSIS — M545 Low back pain, unspecified: Secondary | ICD-10-CM | POA: Diagnosis not present

## 2023-11-08 DIAGNOSIS — M5459 Other low back pain: Secondary | ICD-10-CM | POA: Diagnosis not present

## 2023-11-08 DIAGNOSIS — G8929 Other chronic pain: Secondary | ICD-10-CM | POA: Diagnosis not present

## 2023-11-08 DIAGNOSIS — R2689 Other abnormalities of gait and mobility: Secondary | ICD-10-CM | POA: Diagnosis not present

## 2023-11-08 DIAGNOSIS — R2681 Unsteadiness on feet: Secondary | ICD-10-CM | POA: Diagnosis not present

## 2023-11-08 NOTE — Therapy (Signed)
 OUTPATIENT PHYSICAL THERAPY TREATMENT   Patient Name: Cung Masterson MRN: [id number] DOB:26-Dec-1950, 73 y.o., male Today's Date: 11/08/2023   END OF SESSION:  PT End of Session - 11/08/23 1451     Visit Number 2    Date for PT Re-Evaluation 12/27/23    Authorization Type [employer]    Progress Note Due on Visit 10    PT Start Time 1451    PT Stop Time 1532    PT Time Calculation (min) 41 min    Activity Tolerance Patient tolerated treatment well    Behavior During Therapy WFL for tasks assessed/performed              Past Medical History:  Diagnosis Date   Allergy    "pollen"   Blood transfusion without reported diagnosis    with nasal sinus surgery   Cataract    BILATERAL,REMOVED   Diabetes mellitus without complication (HCC)    Fatty liver    "SOME EVIDENCE ON IMAGING EXAM"   Hyperlipidemia    Hypertension    Past Surgical History:  Procedure Laterality Date   ABDOMINAL SURGERY     scrapnel removed    CATARACTS Bilateral    COLONOSCOPY  03/17/2005   Ovidio Kin, polyp   EYE SURGERY     x 7 removed metal from eyes - shrapnel   HEMORROIDECTOMY     left shoulder surgery     NASAL SINUS SURGERY     POLYPECTOMY     right shoulder surgery     Patient Active Problem List   Diagnosis Date Noted   COVID-19 03/17/2021   Toe injury, left, initial encounter 06/12/2019   DYSPNEA ON EXERTION 07/27/2008   ELECTROCARDIOGRAM, ABNORMAL 07/27/2008   DIABETES-TYPE 2 05/07/2008   Hyperlipidemia 05/07/2008   ED (erectile dysfunction) 05/07/2008   HYPERTENSION, BENIGN ESSENTIAL 05/07/2008    PCP: Olive Bass, FNP   REFERRING PROVIDER: Olive Bass, FNP  REFERRING DIAG:  [phone] (ICD-10-CM) - Chronic low back pain without sciatica, unspecified back pain laterality  R26.89 (ICD-10-CM) - Balance problem  R29.3 (ICD-10-CM) - Poor posture   THERAPY DIAG:  Unsteadiness on feet  Abnormal posture  Muscle weakness  (generalized)  Other low back pain  RATIONALE FOR EVALUATION AND TREATMENT: Rehabilitation  ONSET DATE: ~4 yrs  NEXT MD VISIT: 04/11/24   SUBJECTIVE:  SUBJECTIVE STATEMENT: Patient reports he has been performing his HEP at home with supine exercises on the edge of the bed and on the mat on the floor with no concerns identified.  Eval: Pt reports he finds himself stooping more and more. Feels top heavy and finds himself tripping easily. Also notes more limited endurance - can walk as far as he used to.  Fwd posture contributes to increased back pain.   Pt accompanied by: self  PAIN: Are you having pain? Yes: NPRS scale: 2/10 currently, at worst 4/10  Pain location: B lower back  Pain description: stiff, sore  Aggravating factors: working in the garden, stairs/steps  Relieving factors: sleep, sitting erect, heating pad   PERTINENT HISTORY:  DM-II, diabetic peripheral neuropathy, HTN, HLD, B shoulder surgery  PRECAUTIONS: Fall  RED FLAGS: None  WEIGHT BEARING RESTRICTIONS: No  FALLS:  Has patient fallen in last 6 months? Yes. Number of falls 1  LIVING ENVIRONMENT: Lives with: lives with their spouse Lives in: House/apartment Stairs: Yes: Internal: 14 steps; on right going up and on left going up and External: 4 steps; on right going up, on left going up, and can reach both Has following equipment at home:  elevated commode, walk-in shower  OCCUPATION: Retired  PLOF: Independent and Leisure: gardening - greenhouse, yard work, reading   PATIENT GOALS: "Better posture."   OBJECTIVE: (objective measures completed at initial evaluation unless otherwise dated)  DIAGNOSTIC FINDINGS:  N/A  COGNITION: Overall cognitive status: Within functional limits for tasks  assessed   SENSATION: WFL Except diabetic neuropathy in B feet  POSTURE:  rounded shoulders, forward head, decreased lumbar lordosis, increased thoracic kyphosis, and flexed trunk   PALPATION: Increased muscle tension/tightness in lumbar paraspinals, glutes, piriformis and hip flexors with TTP in upper glutes   LUMBAR ROM:   Active  Eval  Flexion WFL  Extension 75% limited  Right lateral flexion WFL  Left lateral flexion WFL  Right rotation WFL  Left rotation WFL   (Blank rows = not tested)   MUSCLE LENGTH: Hamstrings: mild tight B ITB: mod tight B Piriformis: severe tight B Hip flexors: severe tight B Quads: mod/severe tight B Heelcord:   LOWER EXTREMITY ROM:    Limited B hip IR>ER  LOWER EXTREMITY MMT:    MMT Right eval Left eval  Hip flexion 4+ 4+  Hip extension 4- * 4- *  Hip abduction 4- 4-  Hip adduction 4 4  Hip internal rotation 4+ *  4+ *  Hip external rotation 4+ 4+  Knee flexion 5 5  Knee extension 5 5  Ankle dorsiflexion 4+ 4  Ankle plantarflexion    Ankle inversion    Ankle eversion    (Blank rows = not tested, * - for available ROM)  BED MOBILITY:  Mod independent  TRANSFERS: Assistive device utilized: None  Sit to stand: Complete Independence Stand to sit: Complete Independence Chair to chair:    Floor:     GAIT: Distance walked: clinic distances Assistive device utilized: None Level of assistance: Complete Independence Gait pattern: decreased stride length and trunk flexed Comments:   FUNCTIONAL TESTS: (Remaining tests to be assessed next visit) 5 times sit to stand: 16.41 sec w/o UE assist - tendency for shift to R LE Timed up and go (TUG): 10.16 sec 10 meter walk test: 9.81 sec; Gait speed = 3.34 ft/sec Berg Balance Scale:   Functional gait assessment:    PATIENT SURVEYS:  ABC scale 1280 / 1600 = 80.0 %,  moderate level of physical functioning  Modified Oswestry 13 / 50 = 26.0 %, moderate disability     TODAY'S  TREATMENT:   11/08/2023 PHYSICAL PERFORMANCE TEST or MEASUREMENT: Berg = 50/56, scores of 46-51 indicate moderate (>50%) risk for falls FGA = 24/30, scores of 19-24 indicate medium risk for falls  Berg Balance Test   Sit to Stand Able to stand without using hands and stabilize independently    Standing Unsupported Able to stand safely 2 minutes    Sitting with Back Unsupported but Feet Supported on Floor or Stool Able to sit safely and securely 2 minutes    Stand to Sit Sits safely with minimal use of hands    Transfers Able to transfer safely, minor use of hands    Standing Unsupported with Eyes Closed Able to stand 10 seconds safely    Standing Unsupported with Feet Together Able to place feet together independently and stand 1 minute safely    From Standing, Reach Forward with Outstretched Arm Can reach forward >12 cm safely (5")    From Standing Position, Pick up Object from Floor Able to pick up shoe safely and easily    From Standing Position, Turn to Look Behind Over each Shoulder Looks behind one side only/other side shows less weight shift    Turn 360 Degrees Able to turn 360 degrees safely one side only in 4 seconds or less    Standing Unsupported, Alternately Place Feet on Step/Stool Able to stand independently and safely and complete 8 steps in 20 seconds    Standing Unsupported, One Foot in Front Able to take small step independently and hold 30 seconds    Standing on One Leg Able to lift leg independently and hold 5-10 seconds    Total Score 50    Berg comment: 46-51 moderate (>50%)      Functional Gait  Assessment   Gait Level Surface Walks 20 ft in less than 7 sec but greater than 5.5 sec, uses assistive device, slower speed, mild gait deviations, or deviates 6-10 in outside of the 12 in walkway width.    Change in Gait Speed Able to smoothly change walking speed without loss of balance or gait deviation. Deviate no more than 6 in outside of the 12 in walkway width.    Gait  with Horizontal Head Turns Performs head turns smoothly with no change in gait. Deviates no more than 6 in outside 12 in walkway width    Gait with Vertical Head Turns Performs task with slight change in gait velocity (eg, minor disruption to smooth gait path), deviates 6 - 10 in outside 12 in walkway width or uses assistive device    Gait and Pivot Turn Pivot turns safely within 3 sec and stops quickly with no loss of balance.    Step Over Obstacle Is able to step over 2 stacked shoe boxes taped together (9 in total height) without changing gait speed. No evidence of imbalance.    Gait with Narrow Base of Support Ambulates 4-7 steps.    Gait with Eyes Closed Walks 20 ft, uses assistive device, slower speed, mild gait deviations, deviates 6-10 in outside 12 in walkway width. Ambulates 20 ft in less than 9 sec but greater than 7 sec.    Ambulating Backwards Walks 20 ft, no assistive devices, good speed, no evidence for imbalance, normal gait    Steps Alternating feet, must use rail.    Total Score 24    FGA comment: 19-24 =  medium risk fall      THERAPEUTIC EXERCISE: To improve strength, ROM, and flexibility.  Demonstration, verbal and tactile cues throughout for technique.  Cervical extension SNAG 10 x 3" Doorway pec stretch - best stretch with mid arm position Bridge + GTB hip ABD isometric 10 x 5" - cues to avoid focusing weight into heels to reduce risk for hamstring cramping S/L GTB clam 10 x 3" - cues to avoid posterior trunk rotation   11/01/2023  SELF CARE:  Reviewed eval findings and role of PT in addressing identified deficits as well as instruction in initial HEP (see below).    PATIENT EDUCATION:  Education details: PT eval findings, anticipated POC, need for further assessment of static and dynamic standing balance, and initial HEP  Person educated: Patient Education method: Explanation, Demonstration, Verbal cues, and Handouts Education comprehension: verbalized understanding,  returned demonstration, verbal cues required, and needs further education  HOME EXERCISE PROGRAM: Access Code: [email or web address] URL: [email or web address] Date: 11/08/2023 Prepared by: Glenetta Hew  Exercises - Supine Piriformis Stretch with Foot on Ground  - 1-2 x daily - 7 x weekly - 3 reps - 30 sec hold - Supine Single Knee to Chest Stretch  - 1-2 x daily - 7 x weekly - 3 reps - 30 sec hold - Standing Hip Flexor Stretch  - 1-2 x daily - 7 x weekly - 3 reps - 30 sec hold - Standing Lumbar Extension at Wall - Forearms  - 1-2 x daily - 7 x weekly - 2 sets - 10 reps - 3 sec hold - Mid-Lower Cervical Extension SNAG with Strap  - 1-2 x daily - 7 x weekly - 2 sets - 10 reps - 3 sec hold - Doorway Pec Stretch at 90 Degrees Abduction  - 1-2 x daily - 7 x weekly - 3 reps - 30 sec hold - Supine Bridge with Resistance Band  - 1 x daily - 3-5 x weekly - 2 sets - 10 reps - 5 sec hold - Clam with Resistance  - 1 x daily - 3-5 x weekly - 2 sets - 10 reps - 3-5 sec hold   ASSESSMENT:  CLINICAL IMPRESSION: Completed standardized balance testing with Berg score of 50/56 and FGA score of 24/30 indicating medium/moderate risk for falls.  Safwan "Mellody Dance" denied need for review of HEP but did note difficulty getting his head back to the floor when lying on the floor to perform supine exercises therefore worked on cervical retraction and extension to help promote neutral cervical alignment.  Doorway pec stretch also added to promote scapular retraction and improved upright posture.  He notes the mod Thomas hip flexor stretch was more difficult due to the edge of the bed being soft, therefore encouraged him to perform the standing version of the hip flexor stretch and to defer the [employer] version.  Initiated lumbopelvic strengthening with emphasis on glutes with HEP updated to reflect exercise progression.  Mellody Dance will benefit from continued skilled PT to address ongoing deficits to improve mobility and  activity tolerance to help reach the maximal level of functional independence and mobility.   OBJECTIVE IMPAIRMENTS: Abnormal gait, decreased activity tolerance, decreased balance, decreased coordination, decreased endurance, decreased knowledge of condition, decreased mobility, difficulty walking, decreased ROM, decreased strength, decreased safety awareness, hypomobility, increased fascial restrictions, impaired perceived functional ability, increased muscle spasms, impaired flexibility, improper body mechanics, postural dysfunction, and pain.   ACTIVITY LIMITATIONS: carrying, lifting, bending, standing, squatting, stairs, transfers, reach over head, and locomotion  level  PARTICIPATION LIMITATIONS: meal prep, cleaning, laundry, community activity, and yard work  PERSONAL FACTORS: Age, Fitness, Past/current experiences, Time since onset of injury/illness/exacerbation, and 3+ comorbidities: DM-II, diabetic peripheral neuropathy, HTN, HLD, B shoulder surgery  are also affecting patient's functional outcome.   REHAB POTENTIAL: Good  CLINICAL DECISION MAKING: Evolving/moderate complexity  EVALUATION COMPLEXITY: Moderate   GOALS: Goals reviewed with patient? Yes  SHORT TERM GOALS: Target date: 11/29/2023  Patient will be independent with initial HEP to improve outcomes and carryover.  Baseline:  Goal status: IN PROGRESS  2.  Patient will be educated on strategies to decrease risk of falls.  Baseline:  Goal status: IN PROGRESS  3.  Patient will demonstrate decreased 5xSTS time to </= 12.6 sec to decrease risk for falls with transitional mobility. Baseline: 16.41 sec Goal status: IN PROGRESS  LONG TERM GOALS: Target date: 12/27/2023  Patient will be independent with advanced/ongoing HEP to facilitate ability to maintain/progress functional gains from skilled physical therapy services. Baseline:  Goal status: IN PROGRESS  2.  Patient will be able to ambulate 600' w/o LRAD on variable  surfaces with good safety to access community.  Baseline:  Goal status: IN PROGRESS  3.  Patient will be able to step up/down curb safely with LRAD for safety with community ambulation.  Baseline:  Goal status: IN PROGRESS   4.  Patient will demonstrate improved proximal B LE strength to >/= 4+/5 for improved stability and ease of mobility . Baseline: Refer to above LE MMT table Goal status: IN PROGRESS  5.  Patient will improve Berg score to >/= 54/56 to improve safety and stability with ADLs in standing and reduce risk for falls.  Baseline: 50/56 (11/08/23) Goal status: REVISED   6.  Patient will improve FGA score by at least 4 points (28/30) to improve gait stability and reduce risk for falls.  Baseline: 24/30 (11/08/23) Goal status: REVISED  7.   Patient will report </= 14% on Modified Oswestry to demonstrate improved function with decreased pain interference. Baseline: 13 / 50 = 26.0 % Goal status: IN PROGRESS  8. Patient will report >/= 90% on ABC scale to demonstrate improved balance confidence with functional mobility and gait. Baseline: 1280 / 1600 = 80.0 % Goal status: IN PROGRESS  9.  Patient to demonstrate ability to achieve and maintain good spinal alignment/posturing and body mechanics needed for daily activities. Baseline: Fwd flexed, fwd head and rounded shoulder posture Goal status: IN PROGRESS   PLAN:  PT FREQUENCY: 2x/week  PT DURATION: 8 weeks  PLANNED INTERVENTIONS: [id number]- PT Re-evaluation, 97110-Therapeutic exercises, 97530- Therapeutic activity, 97112- Neuromuscular re-education, 97535- Self Care, 16109- Manual therapy, [phone]- Gait training, [phone]- Aquatic Therapy, [phone]- Electrical stimulation (unattended), 97035- Ultrasound, 29562- Traction (mechanical), Patient/Family education, Balance training, Stair training, Taping, Dry Needling, Joint mobilization, Spinal mobilization, Cryotherapy, Moist heat, and 97750- Physical Performance Test or Measurement    PLAN FOR NEXT SESSION:  progress lumbar extension ROM and proximal LE flexibility, esp hip flexors; lumbopelvic strengthening with glute emphasis; review & update/modify HEP PRN; MT +/- TPDN to address abnormal muscle tension/tightness   Marry Guan, PT 11/08/2023, 8:29 PM

## 2023-11-11 ENCOUNTER — Ambulatory Visit

## 2023-11-11 DIAGNOSIS — R2681 Unsteadiness on feet: Secondary | ICD-10-CM

## 2023-11-11 DIAGNOSIS — M6281 Muscle weakness (generalized): Secondary | ICD-10-CM | POA: Diagnosis not present

## 2023-11-11 DIAGNOSIS — R2689 Other abnormalities of gait and mobility: Secondary | ICD-10-CM | POA: Diagnosis not present

## 2023-11-11 DIAGNOSIS — M5459 Other low back pain: Secondary | ICD-10-CM

## 2023-11-11 DIAGNOSIS — G8929 Other chronic pain: Secondary | ICD-10-CM | POA: Diagnosis not present

## 2023-11-11 DIAGNOSIS — R293 Abnormal posture: Secondary | ICD-10-CM

## 2023-11-11 DIAGNOSIS — M545 Low back pain, unspecified: Secondary | ICD-10-CM | POA: Diagnosis not present

## 2023-11-11 NOTE — Therapy (Signed)
 OUTPATIENT PHYSICAL THERAPY TREATMENT   Patient Name: Brian [name] MRN: [id number] DOB:[birth year], 73 y.o., male Today's Date: 11/11/2023   END OF SESSION:  PT End of Session - 11/11/23 1348     Visit Number 3    Date for PT Re-Evaluation 12/27/23    Authorization Type [employer]    Progress Note Due on Visit 10    PT Start Time 1316    PT Stop Time 1359    PT Time Calculation (min) 43 min    Activity Tolerance Patient tolerated treatment well    Behavior During Therapy WFL for tasks assessed/performed               Past Medical History:  Diagnosis Date   Allergy    "pollen"   Blood transfusion without reported diagnosis    with nasal sinus surgery   Cataract    BILATERAL,REMOVED   Diabetes mellitus without complication (HCC)    Fatty liver    "SOME EVIDENCE ON IMAGING EXAM"   Hyperlipidemia    Hypertension    Past Surgical History:  Procedure Laterality Date   ABDOMINAL SURGERY     scrapnel removed    CATARACTS Bilateral    COLONOSCOPY  03/17/2005   Ovidio Kin, polyp   EYE SURGERY     x 7 removed metal from eyes - shrapnel   HEMORROIDECTOMY     left shoulder surgery     NASAL SINUS SURGERY     POLYPECTOMY     right shoulder surgery     Patient Active Problem List   Diagnosis Date Noted   COVID-19 03/17/2021   Toe injury, left, initial encounter 06/12/2019   DYSPNEA ON EXERTION 07/27/2008   ELECTROCARDIOGRAM, ABNORMAL 07/27/2008   DIABETES-TYPE 2 05/07/2008   Hyperlipidemia 05/07/2008   ED (erectile dysfunction) 05/07/2008   HYPERTENSION, BENIGN ESSENTIAL 05/07/2008    PCP: Olive Bass, FNP   REFERRING PROVIDER: Olive Bass, FNP  REFERRING DIAG:  [phone] (ICD-10-CM) - Chronic low back pain without sciatica, unspecified back pain laterality  R26.89 (ICD-10-CM) - Balance problem  R29.3 (ICD-10-CM) - Poor posture   THERAPY DIAG:  Unsteadiness on feet  Abnormal posture  Muscle weakness  (generalized)  Other low back pain  RATIONALE FOR EVALUATION AND TREATMENT: Rehabilitation  ONSET DATE: ~4 yrs  NEXT MD VISIT: 04/11/24   SUBJECTIVE:  SUBJECTIVE STATEMENT: Doing good just real stiff  Eval: Pt reports he finds himself stooping more and more. Feels top heavy and finds himself tripping easily. Also notes more limited endurance - can walk as far as he used to.  Fwd posture contributes to increased back pain.   Pt accompanied by: self  PAIN: Are you having pain? Yes: NPRS scale: 2/10 currently, at worst 4/10  Pain location: B lower back  Pain description: stiff, sore  Aggravating factors: working in the garden, stairs/steps  Relieving factors: sleep, sitting erect, heating pad   PERTINENT HISTORY:  DM-II, diabetic peripheral neuropathy, HTN, HLD, B shoulder surgery  PRECAUTIONS: Fall  RED FLAGS: None  WEIGHT BEARING RESTRICTIONS: No  FALLS:  Has patient fallen in last 6 months? Yes. Number of falls 1  LIVING ENVIRONMENT: Lives with: lives with their spouse Lives in: House/apartment Stairs: Yes: Internal: 14 steps; on right going up and on left going up and External: 4 steps; on right going up, on left going up, and can reach both Has following equipment at home:  elevated commode, walk-in shower  OCCUPATION: Retired  PLOF: Independent and Leisure: gardening - greenhouse, yard work, reading   PATIENT GOALS: "Better posture."   OBJECTIVE: (objective measures completed at initial evaluation unless otherwise dated)  DIAGNOSTIC FINDINGS:  N/A  COGNITION: Overall cognitive status: Within functional limits for tasks assessed   SENSATION: WFL Except diabetic neuropathy in B feet  POSTURE:  rounded shoulders, forward head, decreased lumbar lordosis,  increased thoracic kyphosis, and flexed trunk   PALPATION: Increased muscle tension/tightness in lumbar paraspinals, glutes, piriformis and hip flexors with TTP in upper glutes   LUMBAR ROM:   Active  Eval  Flexion WFL  Extension 75% limited  Right lateral flexion WFL  Left lateral flexion WFL  Right rotation WFL  Left rotation WFL   (Blank rows = not tested)   MUSCLE LENGTH: Hamstrings: mild tight B ITB: mod tight B Piriformis: severe tight B Hip flexors: severe tight B Quads: mod/severe tight B Heelcord:   LOWER EXTREMITY ROM:    Limited B hip IR>ER  LOWER EXTREMITY MMT:    MMT Right eval Left eval  Hip flexion 4+ 4+  Hip extension 4- * 4- *  Hip abduction 4- 4-  Hip adduction 4 4  Hip internal rotation 4+ *  4+ *  Hip external rotation 4+ 4+  Knee flexion 5 5  Knee extension 5 5  Ankle dorsiflexion 4+ 4  Ankle plantarflexion    Ankle inversion    Ankle eversion    (Blank rows = not tested, * - for available ROM)  BED MOBILITY:  Mod independent  TRANSFERS: Assistive device utilized: None  Sit to stand: Complete Independence Stand to sit: Complete Independence Chair to chair:    Floor:     GAIT: Distance walked: clinic distances Assistive device utilized: None Level of assistance: Complete Independence Gait pattern: decreased stride length and trunk flexed Comments:   FUNCTIONAL TESTS: (Remaining tests to be assessed next visit) 5 times sit to stand: 16.41 sec w/o UE assist - tendency for shift to R LE Timed up and go (TUG): 10.16 sec 10 meter walk test: 9.81 sec; Gait speed = 3.34 ft/sec Berg Balance Scale:   Functional gait assessment:    PATIENT SURVEYS:  ABC scale 1280 / 1600 = 80.0 %, moderate level of physical functioning  Modified Oswestry 13 / 50 = 26.0 %, moderate disability     TODAY'S TREATMENT:  11/11/23  Nustep L5x65min Standing lumbar extension at wall 10x5" Standing runner/hip flexor stretch 2x30" Runner stretch position with  bil shoulder flexion up wall x 10 LTR both ways 5x10" Supine heel slides w/ hip flexor elongation x 10 SKTC x 30" bil Standing hip extension x 10 bil Standing heel raises x 10   11/08/2023 PHYSICAL PERFORMANCE TEST or MEASUREMENT: Berg = 50/56, scores of 46-51 indicate moderate (>50%) risk for falls FGA = 24/30, scores of 19-24 indicate medium risk for falls  Berg Balance Test   Sit to Stand Able to stand without using hands and stabilize independently    Standing Unsupported Able to stand safely 2 minutes    Sitting with Back Unsupported but Feet Supported on Floor or Stool Able to sit safely and securely 2 minutes    Stand to Sit Sits safely with minimal use of hands    Transfers Able to transfer safely, minor use of hands    Standing Unsupported with Eyes Closed Able to stand 10 seconds safely    Standing Unsupported with Feet Together Able to place feet together independently and stand 1 minute safely    From Standing, Reach Forward with Outstretched Arm Can reach forward >12 cm safely (5")    From Standing Position, Pick up Object from Floor Able to pick up shoe safely and easily    From Standing Position, Turn to Look Behind Over each Shoulder Looks behind one side only/other side shows less weight shift    Turn 360 Degrees Able to turn 360 degrees safely one side only in 4 seconds or less    Standing Unsupported, Alternately Place Feet on Step/Stool Able to stand independently and safely and complete 8 steps in 20 seconds    Standing Unsupported, One Foot in Front Able to take small step independently and hold 30 seconds    Standing on One Leg Able to lift leg independently and hold 5-10 seconds    Total Score 50    Berg comment: 46-51 moderate (>50%)      Functional Gait  Assessment   Gait Level Surface Walks 20 ft in less than 7 sec but greater than 5.5 sec, uses assistive device, slower speed, mild gait deviations, or deviates 6-10 in outside of the 12 in walkway width.     Change in Gait Speed Able to smoothly change walking speed without loss of balance or gait deviation. Deviate no more than 6 in outside of the 12 in walkway width.    Gait with Horizontal Head Turns Performs head turns smoothly with no change in gait. Deviates no more than 6 in outside 12 in walkway width    Gait with Vertical Head Turns Performs task with slight change in gait velocity (eg, minor disruption to smooth gait path), deviates 6 - 10 in outside 12 in walkway width or uses assistive device    Gait and Pivot Turn Pivot turns safely within 3 sec and stops quickly with no loss of balance.    Step Over Obstacle Is able to step over 2 stacked shoe boxes taped together (9 in total height) without changing gait speed. No evidence of imbalance.    Gait with Narrow Base of Support Ambulates 4-7 steps.    Gait with Eyes Closed Walks 20 ft, uses assistive device, slower speed, mild gait deviations, deviates 6-10 in outside 12 in walkway width. Ambulates 20 ft in less than 9 sec but greater than 7 sec.    Ambulating Backwards Walks 20 ft, no assistive  devices, good speed, no evidence for imbalance, normal gait    Steps Alternating feet, must use rail.    Total Score 24    FGA comment: 19-24 = medium risk fall      THERAPEUTIC EXERCISE: To improve strength, ROM, and flexibility.  Demonstration, verbal and tactile cues throughout for technique.  Cervical extension SNAG 10 x 3" Doorway pec stretch - best stretch with mid arm position Bridge + GTB hip ABD isometric 10 x 5" - cues to avoid focusing weight into heels to reduce risk for hamstring cramping S/L GTB clam 10 x 3" - cues to avoid posterior trunk rotation   11/01/2023  SELF CARE:  Reviewed eval findings and role of PT in addressing identified deficits as well as instruction in initial HEP (see below).    PATIENT EDUCATION:  Education details: PT eval findings, anticipated POC, need for further assessment of static and dynamic standing  balance, and initial HEP  Person educated: Patient Education method: Explanation, Demonstration, Verbal cues, and Handouts Education comprehension: verbalized understanding, returned demonstration, verbal cues required, and needs further education  HOME EXERCISE PROGRAM: Access Code: [email or web address] URL: [email or web address] Date: 11/08/2023 Prepared by: Glenetta Hew  Exercises - Supine Piriformis Stretch with Foot on Ground  - 1-2 x daily - 7 x weekly - 3 reps - 30 sec hold - Supine Single Knee to Chest Stretch  - 1-2 x daily - 7 x weekly - 3 reps - 30 sec hold - Standing Hip Flexor Stretch  - 1-2 x daily - 7 x weekly - 3 reps - 30 sec hold - Standing Lumbar Extension at Wall - Forearms  - 1-2 x daily - 7 x weekly - 2 sets - 10 reps - 3 sec hold - Mid-Lower Cervical Extension SNAG with Strap  - 1-2 x daily - 7 x weekly - 2 sets - 10 reps - 3 sec hold - Doorway Pec Stretch at 90 Degrees Abduction  - 1-2 x daily - 7 x weekly - 3 reps - 30 sec hold - Supine Bridge with Resistance Band  - 1 x daily - 3-5 x weekly - 2 sets - 10 reps - 5 sec hold - Clam with Resistance  - 1 x daily - 3-5 x weekly - 2 sets - 10 reps - 3-5 sec hold   ASSESSMENT:  CLINICAL IMPRESSION: Pt presents today with lumbar flexed posture with decreased B hip extension. Interventions focused on lumbo-pelvic and hip stretching to improve his posture. By the end of the session his posture improved and he reported less stiffness. He continues to benefit from skilled therapy. Mellody Dance will benefit from continued skilled PT to address ongoing deficits to improve mobility and activity tolerance to help reach the maximal level of functional independence and mobility.   OBJECTIVE IMPAIRMENTS: Abnormal gait, decreased activity tolerance, decreased balance, decreased coordination, decreased endurance, decreased knowledge of condition, decreased mobility, difficulty walking, decreased ROM, decreased strength, decreased safety  awareness, hypomobility, increased fascial restrictions, impaired perceived functional ability, increased muscle spasms, impaired flexibility, improper body mechanics, postural dysfunction, and pain.   ACTIVITY LIMITATIONS: carrying, lifting, bending, standing, squatting, stairs, transfers, reach over head, and locomotion level  PARTICIPATION LIMITATIONS: meal prep, cleaning, laundry, community activity, and yard work  PERSONAL FACTORS: Age, Fitness, Past/current experiences, Time since onset of injury/illness/exacerbation, and 3+ comorbidities: DM-II, diabetic peripheral neuropathy, HTN, HLD, B shoulder surgery  are also affecting patient's functional outcome.   REHAB POTENTIAL: Good  CLINICAL DECISION MAKING: Evolving/moderate complexity  EVALUATION  COMPLEXITY: Moderate   GOALS: Goals reviewed with patient? Yes  SHORT TERM GOALS: Target date: 11/29/2023  Patient will be independent with initial HEP to improve outcomes and carryover.  Baseline:  Goal status: IN PROGRESS  2.  Patient will be educated on strategies to decrease risk of falls.  Baseline:  Goal status: IN PROGRESS  3.  Patient will demonstrate decreased 5xSTS time to </= 12.6 sec to decrease risk for falls with transitional mobility. Baseline: 16.41 sec Goal status: IN PROGRESS  LONG TERM GOALS: Target date: 12/27/2023  Patient will be independent with advanced/ongoing HEP to facilitate ability to maintain/progress functional gains from skilled physical therapy services. Baseline:  Goal status: IN PROGRESS  2.  Patient will be able to ambulate 600' w/o LRAD on variable surfaces with good safety to access community.  Baseline:  Goal status: IN PROGRESS  3.  Patient will be able to step up/down curb safely with LRAD for safety with community ambulation.  Baseline:  Goal status: IN PROGRESS   4.  Patient will demonstrate improved proximal B LE strength to >/= 4+/5 for improved stability and ease of mobility  . Baseline: Refer to above LE MMT table Goal status: IN PROGRESS  5.  Patient will improve Berg score to >/= 54/56 to improve safety and stability with ADLs in standing and reduce risk for falls.  Baseline: 50/56 (11/08/23) Goal status: REVISED   6.  Patient will improve FGA score by at least 4 points (28/30) to improve gait stability and reduce risk for falls.  Baseline: 24/30 (11/08/23) Goal status: REVISED  7.   Patient will report </= 14% on Modified Oswestry to demonstrate improved function with decreased pain interference. Baseline: 13 / 50 = 26.0 % Goal status: IN PROGRESS  8. Patient will report >/= 90% on ABC scale to demonstrate improved balance confidence with functional mobility and gait. Baseline: 1280 / 1600 = 80.0 % Goal status: IN PROGRESS  9.  Patient to demonstrate ability to achieve and maintain good spinal alignment/posturing and body mechanics needed for daily activities. Baseline: Fwd flexed, fwd head and rounded shoulder posture Goal status: IN PROGRESS   PLAN:  PT FREQUENCY: 2x/week  PT DURATION: 8 weeks  PLANNED INTERVENTIONS: [id number]- PT Re-evaluation, 97110-Therapeutic exercises, 97530- Therapeutic activity, 97112- Neuromuscular re-education, 97535- Self Care, 47829- Manual therapy, [phone]- Gait training, [phone]- Aquatic Therapy, [phone]- Electrical stimulation (unattended), 97035- Ultrasound, 29528- Traction (mechanical), Patient/Family education, Balance training, Stair training, Taping, Dry Needling, Joint mobilization, Spinal mobilization, Cryotherapy, Moist heat, and 97750- Physical Performance Test or Measurement   PLAN FOR NEXT SESSION:  progress lumbar extension ROM and proximal LE flexibility, esp hip flexors; lumbopelvic strengthening with glute emphasis; review & update/modify HEP PRN; MT +/- TPDN to address abnormal muscle tension/tightness   Darleene Cleaver, PTA 11/11/2023, 2:12 PM

## 2023-11-15 ENCOUNTER — Ambulatory Visit

## 2023-11-15 DIAGNOSIS — M5459 Other low back pain: Secondary | ICD-10-CM

## 2023-11-15 DIAGNOSIS — M6281 Muscle weakness (generalized): Secondary | ICD-10-CM

## 2023-11-15 DIAGNOSIS — R2681 Unsteadiness on feet: Secondary | ICD-10-CM | POA: Diagnosis not present

## 2023-11-15 DIAGNOSIS — R293 Abnormal posture: Secondary | ICD-10-CM | POA: Diagnosis not present

## 2023-11-15 DIAGNOSIS — G8929 Other chronic pain: Secondary | ICD-10-CM | POA: Diagnosis not present

## 2023-11-15 DIAGNOSIS — M545 Low back pain, unspecified: Secondary | ICD-10-CM | POA: Diagnosis not present

## 2023-11-15 DIAGNOSIS — R2689 Other abnormalities of gait and mobility: Secondary | ICD-10-CM | POA: Diagnosis not present

## 2023-11-15 NOTE — Therapy (Signed)
 OUTPATIENT PHYSICAL THERAPY TREATMENT   Patient Name: Brian [name] MRN: [id number] DOB:Feb 18, 1951, 73 y.o., male Today's Date: 11/15/2023   END OF SESSION:  PT End of Session - 11/15/23 0849     Visit Number 4    Date for PT Re-Evaluation 12/27/23    Authorization Type [employer]    Progress Note Due on Visit 10    PT Start Time 0845    PT Stop Time 0928    PT Time Calculation (min) 43 min    Activity Tolerance Patient tolerated treatment well    Behavior During Therapy WFL for tasks assessed/performed                Past Medical History:  Diagnosis Date   Allergy    "pollen"   Blood transfusion without reported diagnosis    with nasal sinus surgery   Cataract    BILATERAL,REMOVED   Diabetes mellitus without complication (HCC)    Fatty liver    "SOME EVIDENCE ON IMAGING EXAM"   Hyperlipidemia    Hypertension    Past Surgical History:  Procedure Laterality Date   ABDOMINAL SURGERY     scrapnel removed    CATARACTS Bilateral    COLONOSCOPY  03/17/2005   Ovidio Kin, polyp   EYE SURGERY     x 7 removed metal from eyes - shrapnel   HEMORROIDECTOMY     left shoulder surgery     NASAL SINUS SURGERY     POLYPECTOMY     right shoulder surgery     Patient Active Problem List   Diagnosis Date Noted   COVID-19 03/17/2021   Toe injury, left, initial encounter 06/12/2019   DYSPNEA ON EXERTION 07/27/2008   ELECTROCARDIOGRAM, ABNORMAL 07/27/2008   DIABETES-TYPE 2 05/07/2008   Hyperlipidemia 05/07/2008   ED (erectile dysfunction) 05/07/2008   HYPERTENSION, BENIGN ESSENTIAL 05/07/2008    PCP: Olive Bass, FNP   REFERRING PROVIDER: Olive Bass, FNP  REFERRING DIAG:  [phone] (ICD-10-CM) - Chronic low back pain without sciatica, unspecified back pain laterality  R26.89 (ICD-10-CM) - Balance problem  R29.3 (ICD-10-CM) - Poor posture   THERAPY DIAG:  Unsteadiness on feet  Abnormal posture  Muscle weakness  (generalized)  Other low back pain  RATIONALE FOR EVALUATION AND TREATMENT: Rehabilitation  ONSET DATE: ~4 yrs  NEXT MD VISIT: 04/11/24   SUBJECTIVE:  SUBJECTIVE STATEMENT: Pt reports he feels pretty good today.  Eval: Pt reports he finds himself stooping more and more. Feels top heavy and finds himself tripping easily. Also notes more limited endurance - can walk as far as he used to.  Fwd posture contributes to increased back pain.   Pt accompanied by: self  PAIN: Are you having pain? Yes: NPRS scale: 0/10 now, tightness in glutes and low back, hips Pain location: B lower back  Pain description: stiff, sore  Aggravating factors: working in the garden, stairs/steps  Relieving factors: sleep, sitting erect, heating pad   PERTINENT HISTORY:  DM-II, diabetic peripheral neuropathy, HTN, HLD, B shoulder surgery  PRECAUTIONS: Fall  RED FLAGS: None  WEIGHT BEARING RESTRICTIONS: No  FALLS:  Has patient fallen in last 6 months? Yes. Number of falls 1  LIVING ENVIRONMENT: Lives with: lives with their spouse Lives in: House/apartment Stairs: Yes: Internal: 14 steps; on right going up and on left going up and External: 4 steps; on right going up, on left going up, and can reach both Has following equipment at home:  elevated commode, walk-in shower  OCCUPATION: Retired  PLOF: Independent and Leisure: gardening - greenhouse, yard work, reading   PATIENT GOALS: "Better posture."   OBJECTIVE: (objective measures completed at initial evaluation unless otherwise dated)  DIAGNOSTIC FINDINGS:  N/A  COGNITION: Overall cognitive status: Within functional limits for tasks assessed   SENSATION: WFL Except diabetic neuropathy in B feet  POSTURE:  rounded shoulders, forward head,  decreased lumbar lordosis, increased thoracic kyphosis, and flexed trunk   PALPATION: Increased muscle tension/tightness in lumbar paraspinals, glutes, piriformis and hip flexors with TTP in upper glutes   LUMBAR ROM:   Active  Eval  Flexion WFL  Extension 75% limited  Right lateral flexion WFL  Left lateral flexion WFL  Right rotation WFL  Left rotation WFL   (Blank rows = not tested)   MUSCLE LENGTH: Hamstrings: mild tight B ITB: mod tight B Piriformis: severe tight B Hip flexors: severe tight B Quads: mod/severe tight B Heelcord:   LOWER EXTREMITY ROM:    Limited B hip IR>ER  LOWER EXTREMITY MMT:    MMT Right eval Left eval  Hip flexion 4+ 4+  Hip extension 4- * 4- *  Hip abduction 4- 4-  Hip adduction 4 4  Hip internal rotation 4+ *  4+ *  Hip external rotation 4+ 4+  Knee flexion 5 5  Knee extension 5 5  Ankle dorsiflexion 4+ 4  Ankle plantarflexion    Ankle inversion    Ankle eversion    (Blank rows = not tested, * - for available ROM)  BED MOBILITY:  Mod independent  TRANSFERS: Assistive device utilized: None  Sit to stand: Complete Independence Stand to sit: Complete Independence Chair to chair:    Floor:     GAIT: Distance walked: clinic distances Assistive device utilized: None Level of assistance: Complete Independence Gait pattern: decreased stride length and trunk flexed Comments:   FUNCTIONAL TESTS: (Remaining tests to be assessed next visit) 5 times sit to stand: 16.41 sec w/o UE assist - tendency for shift to R LE Timed up and go (TUG): 10.16 sec 10 meter walk test: 9.81 sec; Gait speed = 3.34 ft/sec Berg Balance Scale:   Functional gait assessment:    PATIENT SURVEYS:  ABC scale 1280 / 1600 = 80.0 %, moderate level of physical functioning  Modified Oswestry 13 / 50 = 26.0 %, moderate disability  TODAY'S TREATMENT:  11/15/23 Therapeutic Exercise: to improve strength, ROM, flexibility, and endurance  Nustep L6x30min  UE/LE Standing lumbar extension x 5 Standing hip extension 2x10 B- cues to avoid leaning forward Standing hip abduction 2x10 B- cues to avoid rotating R hip, postural cues Ladder walk-ups to the top for trunk extension 10x5" LTR 5x10" B S/L clamshell GTB 2x10 bil   NEUROMUSCULAR RE-EDUCATION: To improve proprioception, balance, and kinesthesia. Standing rows + scap retraction x 20 GTB- cues to retract shoulder blades Standing shld ext + scap retraction x 12 GTB Pallof press GTB 2x10 each direction Bridge with GTB 2x10  11/11/23 Nustep L5x20min Standing lumbar extension at wall 10x5" Standing runner/hip flexor stretch 2x30" Runner stretch position with bil shoulder flexion up wall x 10 LTR both ways 5x10" Supine heel slides w/ hip flexor elongation x 10 SKTC x 30" bil Standing hip extension x 10 bil Standing heel raises x 10   11/08/2023 PHYSICAL PERFORMANCE TEST or MEASUREMENT: Berg = 50/56, scores of 46-51 indicate moderate (>50%) risk for falls FGA = 24/30, scores of 19-24 indicate medium risk for falls  Berg Balance Test   Sit to Stand Able to stand without using hands and stabilize independently    Standing Unsupported Able to stand safely 2 minutes    Sitting with Back Unsupported but Feet Supported on Floor or Stool Able to sit safely and securely 2 minutes    Stand to Sit Sits safely with minimal use of hands    Transfers Able to transfer safely, minor use of hands    Standing Unsupported with Eyes Closed Able to stand 10 seconds safely    Standing Unsupported with Feet Together Able to place feet together independently and stand 1 minute safely    From Standing, Reach Forward with Outstretched Arm Can reach forward >12 cm safely (5")    From Standing Position, Pick up Object from Floor Able to pick up shoe safely and easily    From Standing Position, Turn to Look Behind Over each Shoulder Looks behind one side only/other side shows less weight shift    Turn 360 Degrees  Able to turn 360 degrees safely one side only in 4 seconds or less    Standing Unsupported, Alternately Place Feet on Step/Stool Able to stand independently and safely and complete 8 steps in 20 seconds    Standing Unsupported, One Foot in Front Able to take small step independently and hold 30 seconds    Standing on One Leg Able to lift leg independently and hold 5-10 seconds    Total Score 50    Berg comment: 46-51 moderate (>50%)      Functional Gait  Assessment   Gait Level Surface Walks 20 ft in less than 7 sec but greater than 5.5 sec, uses assistive device, slower speed, mild gait deviations, or deviates 6-10 in outside of the 12 in walkway width.    Change in Gait Speed Able to smoothly change walking speed without loss of balance or gait deviation. Deviate no more than 6 in outside of the 12 in walkway width.    Gait with Horizontal Head Turns Performs head turns smoothly with no change in gait. Deviates no more than 6 in outside 12 in walkway width    Gait with Vertical Head Turns Performs task with slight change in gait velocity (eg, minor disruption to smooth gait path), deviates 6 - 10 in outside 12 in walkway width or uses assistive device    Gait and  Pivot Turn Pivot turns safely within 3 sec and stops quickly with no loss of balance.    Step Over Obstacle Is able to step over 2 stacked shoe boxes taped together (9 in total height) without changing gait speed. No evidence of imbalance.    Gait with Narrow Base of Support Ambulates 4-7 steps.    Gait with Eyes Closed Walks 20 ft, uses assistive device, slower speed, mild gait deviations, deviates 6-10 in outside 12 in walkway width. Ambulates 20 ft in less than 9 sec but greater than 7 sec.    Ambulating Backwards Walks 20 ft, no assistive devices, good speed, no evidence for imbalance, normal gait    Steps Alternating feet, must use rail.    Total Score 24    FGA comment: 19-24 = medium risk fall      THERAPEUTIC EXERCISE: To  improve strength, ROM, and flexibility.  Demonstration, verbal and tactile cues throughout for technique.  Cervical extension SNAG 10 x 3" Doorway pec stretch - best stretch with mid arm position Bridge + GTB hip ABD isometric 10 x 5" - cues to avoid focusing weight into heels to reduce risk for hamstring cramping S/L GTB clam 10 x 3" - cues to avoid posterior trunk rotation   11/01/2023  SELF CARE:  Reviewed eval findings and role of PT in addressing identified deficits as well as instruction in initial HEP (see below).    PATIENT EDUCATION:  Education details: PT eval findings, anticipated POC, need for further assessment of static and dynamic standing balance, and initial HEP  Person educated: Patient Education method: Explanation, Demonstration, Verbal cues, and Handouts Education comprehension: verbalized understanding, returned demonstration, verbal cues required, and needs further education  HOME EXERCISE PROGRAM: Access Code: [email or web address] URL: [email or web address] Date: 11/08/2023 Prepared by: Glenetta Hew  Exercises - Supine Piriformis Stretch with Foot on Ground  - 1-2 x daily - 7 x weekly - 3 reps - 30 sec hold - Supine Single Knee to Chest Stretch  - 1-2 x daily - 7 x weekly - 3 reps - 30 sec hold - Standing Hip Flexor Stretch  - 1-2 x daily - 7 x weekly - 3 reps - 30 sec hold - Standing Lumbar Extension at Wall - Forearms  - 1-2 x daily - 7 x weekly - 2 sets - 10 reps - 3 sec hold - Mid-Lower Cervical Extension SNAG with Strap  - 1-2 x daily - 7 x weekly - 2 sets - 10 reps - 3 sec hold - Doorway Pec Stretch at 90 Degrees Abduction  - 1-2 x daily - 7 x weekly - 3 reps - 30 sec hold - Supine Bridge with Resistance Band  - 1 x daily - 3-5 x weekly - 2 sets - 10 reps - 5 sec hold - Clam with Resistance  - 1 x daily - 3-5 x weekly - 2 sets - 10 reps - 3-5 sec hold   ASSESSMENT:  CLINICAL IMPRESSION: Pt showed a good response to treatment. We progressed strength  and postural exercises to improve his balance and upright posture. Cues were given as needed to correct his form and target the appropriate muscles. He is demonstrates more upright posture than the last time he came. Pt showing progress thus far. Mellody Dance will benefit from continued skilled PT to address ongoing deficits to improve mobility and activity tolerance to help reach the maximal level of functional independence and mobility.   OBJECTIVE IMPAIRMENTS: Abnormal gait, decreased activity tolerance, decreased  balance, decreased coordination, decreased endurance, decreased knowledge of condition, decreased mobility, difficulty walking, decreased ROM, decreased strength, decreased safety awareness, hypomobility, increased fascial restrictions, impaired perceived functional ability, increased muscle spasms, impaired flexibility, improper body mechanics, postural dysfunction, and pain.   ACTIVITY LIMITATIONS: carrying, lifting, bending, standing, squatting, stairs, transfers, reach over head, and locomotion level  PARTICIPATION LIMITATIONS: meal prep, cleaning, laundry, community activity, and yard work  PERSONAL FACTORS: Age, Fitness, Past/current experiences, Time since onset of injury/illness/exacerbation, and 3+ comorbidities: DM-II, diabetic peripheral neuropathy, HTN, HLD, B shoulder surgery  are also affecting patient's functional outcome.   REHAB POTENTIAL: Good  CLINICAL DECISION MAKING: Evolving/moderate complexity  EVALUATION COMPLEXITY: Moderate   GOALS: Goals reviewed with patient? Yes  SHORT TERM GOALS: Target date: 11/29/2023  Patient will be independent with initial HEP to improve outcomes and carryover.  Baseline:  Goal status: IN PROGRESS  2.  Patient will be educated on strategies to decrease risk of falls.  Baseline:  Goal status: IN PROGRESS  3.  Patient will demonstrate decreased 5xSTS time to </= 12.6 sec to decrease risk for falls with transitional  mobility. Baseline: 16.41 sec Goal status: IN PROGRESS  LONG TERM GOALS: Target date: 12/27/2023  Patient will be independent with advanced/ongoing HEP to facilitate ability to maintain/progress functional gains from skilled physical therapy services. Baseline:  Goal status: IN PROGRESS  2.  Patient will be able to ambulate 600' w/o LRAD on variable surfaces with good safety to access community.  Baseline:  Goal status: IN PROGRESS  3.  Patient will be able to step up/down curb safely with LRAD for safety with community ambulation.  Baseline:  Goal status: IN PROGRESS   4.  Patient will demonstrate improved proximal B LE strength to >/= 4+/5 for improved stability and ease of mobility . Baseline: Refer to above LE MMT table Goal status: IN PROGRESS  5.  Patient will improve Berg score to >/= 54/56 to improve safety and stability with ADLs in standing and reduce risk for falls.  Baseline: 50/56 (11/08/23) Goal status: REVISED   6.  Patient will improve FGA score by at least 4 points (28/30) to improve gait stability and reduce risk for falls.  Baseline: 24/30 (11/08/23) Goal status: REVISED  7.   Patient will report </= 14% on Modified Oswestry to demonstrate improved function with decreased pain interference. Baseline: 13 / 50 = 26.0 % Goal status: IN PROGRESS  8. Patient will report >/= 90% on ABC scale to demonstrate improved balance confidence with functional mobility and gait. Baseline: 1280 / 1600 = 80.0 % Goal status: IN PROGRESS  9.  Patient to demonstrate ability to achieve and maintain good spinal alignment/posturing and body mechanics needed for daily activities. Baseline: Fwd flexed, fwd head and rounded shoulder posture Goal status: IN PROGRESS   PLAN:  PT FREQUENCY: 2x/week  PT DURATION: 8 weeks  PLANNED INTERVENTIONS: 97164- PT Re-evaluation, 97110-Therapeutic exercises, 97530- Therapeutic activity, 97112- Neuromuscular re-education, 97535- Self Care, 16109-  Manual therapy, [phone]- Gait training, [phone]- Aquatic Therapy, [phone]- Electrical stimulation (unattended), 97035- Ultrasound, 29562- Traction (mechanical), Patient/Family education, Balance training, Stair training, Taping, Dry Needling, Joint mobilization, Spinal mobilization, Cryotherapy, Moist heat, and 97750- Physical Performance Test or Measurement   PLAN FOR NEXT SESSION:  progress lumbar extension ROM and proximal LE flexibility, esp hip flexors; lumbopelvic strengthening with glute emphasis; review & update/modify HEP PRN; MT +/- TPDN to address abnormal muscle tension/tightness   Alexza Norbeck L Corbin Hott, PTA 11/15/2023, 9:30 AM

## 2023-11-18 ENCOUNTER — Ambulatory Visit: Attending: Family

## 2023-11-18 DIAGNOSIS — M6281 Muscle weakness (generalized): Secondary | ICD-10-CM | POA: Insufficient documentation

## 2023-11-18 DIAGNOSIS — R293 Abnormal posture: Secondary | ICD-10-CM | POA: Insufficient documentation

## 2023-11-18 DIAGNOSIS — R2681 Unsteadiness on feet: Secondary | ICD-10-CM | POA: Diagnosis not present

## 2023-11-18 DIAGNOSIS — M5459 Other low back pain: Secondary | ICD-10-CM | POA: Insufficient documentation

## 2023-11-18 NOTE — Therapy (Signed)
 OUTPATIENT PHYSICAL THERAPY TREATMENT   Patient Name: Brian [name] MRN: [id number] DOB:1951/06/21, 73 y.o., male Today's Date: 11/18/2023   END OF SESSION:  PT End of Session - 11/18/23 1002     Visit Number 5    Date for PT Re-Evaluation 12/27/23    Authorization Type [employer]    Progress Note Due on Visit 10    PT Start Time 0933    PT Stop Time 1013    PT Time Calculation (min) 40 min    Activity Tolerance Patient tolerated treatment well    Behavior During Therapy WFL for tasks assessed/performed                 Past Medical History:  Diagnosis Date   Allergy    "pollen"   Blood transfusion without reported diagnosis    with nasal sinus surgery   Cataract    BILATERAL,REMOVED   Diabetes mellitus without complication (HCC)    Fatty liver    "SOME EVIDENCE ON IMAGING EXAM"   Hyperlipidemia    Hypertension    Past Surgical History:  Procedure Laterality Date   ABDOMINAL SURGERY     scrapnel removed    CATARACTS Bilateral    COLONOSCOPY  03/17/2005   Ovidio Kin, polyp   EYE SURGERY     x 7 removed metal from eyes - shrapnel   HEMORROIDECTOMY     left shoulder surgery     NASAL SINUS SURGERY     POLYPECTOMY     right shoulder surgery     Patient Active Problem List   Diagnosis Date Noted   COVID-19 03/17/2021   Toe injury, left, initial encounter 06/12/2019   DYSPNEA ON EXERTION 07/27/2008   ELECTROCARDIOGRAM, ABNORMAL 07/27/2008   DIABETES-TYPE 2 05/07/2008   Hyperlipidemia 05/07/2008   ED (erectile dysfunction) 05/07/2008   HYPERTENSION, BENIGN ESSENTIAL 05/07/2008    PCP: Olive Bass, FNP   REFERRING PROVIDER: Olive Bass, FNP  REFERRING DIAG:  [phone] (ICD-10-CM) - Chronic low back pain without sciatica, unspecified back pain laterality  R26.89 (ICD-10-CM) - Balance problem  R29.3 (ICD-10-CM) - Poor posture   THERAPY DIAG:  Unsteadiness on feet  Abnormal posture  Muscle weakness  (generalized)  Other low back pain  RATIONALE FOR EVALUATION AND TREATMENT: Rehabilitation  ONSET DATE: ~4 yrs  NEXT MD VISIT: 04/11/24   SUBJECTIVE:  SUBJECTIVE STATEMENT: Pt reports he feels pretty good today, yesterday he slipped off of his stool while gardening and landing on R buttock, so it's a little tender today.  Eval: Pt reports he finds himself stooping more and more. Feels top heavy and finds himself tripping easily. Also notes more limited endurance - can walk as far as he used to.  Fwd posture contributes to increased back pain.   Pt accompanied by: self  PAIN: Are you having pain? Yes: NPRS scale: 0/10 now, tightness in glutes and low back, hips Pain location: B lower back  Pain description: stiff, sore  Aggravating factors: working in the garden, stairs/steps  Relieving factors: sleep, sitting erect, heating pad   PERTINENT HISTORY:  DM-II, diabetic peripheral neuropathy, HTN, HLD, B shoulder surgery  PRECAUTIONS: Fall  RED FLAGS: None  WEIGHT BEARING RESTRICTIONS: No  FALLS:  Has patient fallen in last 6 months? Yes. Number of falls 1  LIVING ENVIRONMENT: Lives with: lives with their spouse Lives in: House/apartment Stairs: Yes: Internal: 14 steps; on right going up and on left going up and External: 4 steps; on right going up, on left going up, and can reach both Has following equipment at home:  elevated commode, walk-in shower  OCCUPATION: Retired  PLOF: Independent and Leisure: gardening - greenhouse, yard work, reading   PATIENT GOALS: "Better posture."   OBJECTIVE: (objective measures completed at initial evaluation unless otherwise dated)  DIAGNOSTIC FINDINGS:  N/A  COGNITION: Overall cognitive status: Within functional limits for tasks  assessed   SENSATION: WFL Except diabetic neuropathy in B feet  POSTURE:  rounded shoulders, forward head, decreased lumbar lordosis, increased thoracic kyphosis, and flexed trunk   PALPATION: Increased muscle tension/tightness in lumbar paraspinals, glutes, piriformis and hip flexors with TTP in upper glutes   LUMBAR ROM:   Active  Eval  Flexion WFL  Extension 75% limited  Right lateral flexion WFL  Left lateral flexion WFL  Right rotation WFL  Left rotation WFL   (Blank rows = not tested)   MUSCLE LENGTH: Hamstrings: mild tight B ITB: mod tight B Piriformis: severe tight B Hip flexors: severe tight B Quads: mod/severe tight B Heelcord:   LOWER EXTREMITY ROM:    Limited B hip IR>ER  LOWER EXTREMITY MMT:    MMT Right eval Left eval  Hip flexion 4+ 4+  Hip extension 4- * 4- *  Hip abduction 4- 4-  Hip adduction 4 4  Hip internal rotation 4+ *  4+ *  Hip external rotation 4+ 4+  Knee flexion 5 5  Knee extension 5 5  Ankle dorsiflexion 4+ 4  Ankle plantarflexion    Ankle inversion    Ankle eversion    (Blank rows = not tested, * - for available ROM)  BED MOBILITY:  Mod independent  TRANSFERS: Assistive device utilized: None  Sit to stand: Complete Independence Stand to sit: Complete Independence Chair to chair:    Floor:     GAIT: Distance walked: clinic distances Assistive device utilized: None Level of assistance: Complete Independence Gait pattern: decreased stride length and trunk flexed Comments:   FUNCTIONAL TESTS: (Remaining tests to be assessed next visit) 5 times sit to stand: 16.41 sec w/o UE assist - tendency for shift to R LE Timed up and go (TUG): 10.16 sec 10 meter walk test: 9.81 sec; Gait speed = 3.34 ft/sec Berg Balance Scale:   Functional gait assessment:    PATIENT SURVEYS:  ABC scale 1280 / 1600 = 80.0 %,  moderate level of physical functioning  Modified Oswestry 13 / 50 = 26.0 %, moderate disability     TODAY'S  TREATMENT:  11/18/23 Therapeutic Exercise: to improve strength, ROM, flexibility, and endurance  Nustep L6x4min UE/LE Seated R KTOS and figure 4 stretch 2 x 30"  Seated hip up and over bean bag 2 x 10 bil  NEUROMUSCULAR RE-EDUCATION: To improve proprioception, balance, and kinesthesia.  Review of reducing fall risk and safety Standing shoulder extension blue TB x 20 Standing pallof press blue TB x 20 each way Fwd stepping with heel strike, weight shifting  then step back x 10 each side- many cues for posture and form Ladder walk-ups to the top for trunk extension 10x5"  11/15/23 Therapeutic Exercise: to improve strength, ROM, flexibility, and endurance  Nustep L6x46min UE/LE Standing lumbar extension x 5 Standing hip extension 2x10 B- cues to avoid leaning forward Standing hip abduction 2x10 B- cues to avoid rotating R hip, postural cues Ladder walk-ups to the top for trunk extension 10x5" LTR 5x10" B S/L clamshell GTB 2x10 bil   NEUROMUSCULAR RE-EDUCATION: To improve proprioception, balance, and kinesthesia. Standing rows + scap retraction x 20 GTB- cues to retract shoulder blades Standing shld ext + scap retraction x 12 GTB Pallof press GTB 2x10 each direction Bridge with GTB 2x10  11/11/23 Nustep L5x58min Standing lumbar extension at wall 10x5" Standing runner/hip flexor stretch 2x30" Runner stretch position with bil shoulder flexion up wall x 10 LTR both ways 5x10" Supine heel slides w/ hip flexor elongation x 10 SKTC x 30" bil Standing hip extension x 10 bil Standing heel raises x 10   11/08/2023 PHYSICAL PERFORMANCE TEST or MEASUREMENT: Berg = 50/56, scores of 46-51 indicate moderate (>50%) risk for falls FGA = 24/30, scores of 19-24 indicate medium risk for falls  Berg Balance Test   Sit to Stand Able to stand without using hands and stabilize independently    Standing Unsupported Able to stand safely 2 minutes    Sitting with Back Unsupported but Feet Supported on  Floor or Stool Able to sit safely and securely 2 minutes    Stand to Sit Sits safely with minimal use of hands    Transfers Able to transfer safely, minor use of hands    Standing Unsupported with Eyes Closed Able to stand 10 seconds safely    Standing Unsupported with Feet Together Able to place feet together independently and stand 1 minute safely    From Standing, Reach Forward with Outstretched Arm Can reach forward >12 cm safely (5")    From Standing Position, Pick up Object from Floor Able to pick up shoe safely and easily    From Standing Position, Turn to Look Behind Over each Shoulder Looks behind one side only/other side shows less weight shift    Turn 360 Degrees Able to turn 360 degrees safely one side only in 4 seconds or less    Standing Unsupported, Alternately Place Feet on Step/Stool Able to stand independently and safely and complete 8 steps in 20 seconds    Standing Unsupported, One Foot in Front Able to take small step independently and hold 30 seconds    Standing on One Leg Able to lift leg independently and hold 5-10 seconds    Total Score 50    Berg comment: 46-51 moderate (>50%)      Functional Gait  Assessment   Gait Level Surface Walks 20 ft in less than 7 sec but greater than 5.5 sec, uses assistive  device, slower speed, mild gait deviations, or deviates 6-10 in outside of the 12 in walkway width.    Change in Gait Speed Able to smoothly change walking speed without loss of balance or gait deviation. Deviate no more than 6 in outside of the 12 in walkway width.    Gait with Horizontal Head Turns Performs head turns smoothly with no change in gait. Deviates no more than 6 in outside 12 in walkway width    Gait with Vertical Head Turns Performs task with slight change in gait velocity (eg, minor disruption to smooth gait path), deviates 6 - 10 in outside 12 in walkway width or uses assistive device    Gait and Pivot Turn Pivot turns safely within 3 sec and stops quickly  with no loss of balance.    Step Over Obstacle Is able to step over 2 stacked shoe boxes taped together (9 in total height) without changing gait speed. No evidence of imbalance.    Gait with Narrow Base of Support Ambulates 4-7 steps.    Gait with Eyes Closed Walks 20 ft, uses assistive device, slower speed, mild gait deviations, deviates 6-10 in outside 12 in walkway width. Ambulates 20 ft in less than 9 sec but greater than 7 sec.    Ambulating Backwards Walks 20 ft, no assistive devices, good speed, no evidence for imbalance, normal gait    Steps Alternating feet, must use rail.    Total Score 24    FGA comment: 19-24 = medium risk fall      THERAPEUTIC EXERCISE: To improve strength, ROM, and flexibility.  Demonstration, verbal and tactile cues throughout for technique.  Cervical extension SNAG 10 x 3" Doorway pec stretch - best stretch with mid arm position Bridge + GTB hip ABD isometric 10 x 5" - cues to avoid focusing weight into heels to reduce risk for hamstring cramping S/L GTB clam 10 x 3" - cues to avoid posterior trunk rotation   11/01/2023  SELF CARE:  Reviewed eval findings and role of PT in addressing identified deficits as well as instruction in initial HEP (see below).    PATIENT EDUCATION:  Education details: HEP update- standing pallof and shoulder extension Person educated: Patient Education method: Explanation, Demonstration, Verbal cues, and Handouts Education comprehension: verbalized understanding, returned demonstration, verbal cues required, and needs further education  HOME EXERCISE PROGRAM: Access Code: [email or web address] URL: [email or web address] Date: 11/18/2023 Prepared by: Verta Ellen  Exercises - Supine Piriformis Stretch with Foot on Ground  - 1-2 x daily - 7 x weekly - 3 reps - 30 sec hold - Supine Single Knee to Chest Stretch  - 1-2 x daily - 7 x weekly - 3 reps - 30 sec hold - Standing Hip Flexor Stretch  - 1-2 x daily - 7 x weekly - 3  reps - 30 sec hold - Standing Lumbar Extension at Wall - Forearms  - 1-2 x daily - 7 x weekly - 2 sets - 10 reps - 3 sec hold - Mid-Lower Cervical Extension SNAG with Strap  - 1-2 x daily - 7 x weekly - 2 sets - 10 reps - 3 sec hold - Doorway Pec Stretch at 90 Degrees Abduction  - 1-2 x daily - 7 x weekly - 3 reps - 30 sec hold - Supine Bridge with Resistance Band  - 1 x daily - 3-5 x weekly - 2 sets - 10 reps - 5 sec hold - Clam with Resistance  - 1 x daily -  3-5 x weekly - 2 sets - 10 reps - 3-5 sec hold - Shoulder extension with resistance - Neutral  - 1 x daily - 3-5 x weekly - 2-3 sets - 10 reps - Standing Anti-Rotation Press with Anchored Resistance  - 1 x daily - 3-5 x weekly - 2-3 sets - 10 reps   ASSESSMENT:  CLINICAL IMPRESSION: Pt denies any concern with completing HEP and we reviewed strategies for reducing fall risk, so he has met STGs #1 and #2. He was sore in his R buttock from his fall yesterday but he reported this was minor. We continued working on hip flexibility, core activation, and motor training to improve his gait mechanics. Many cues required for posture and proper weight shift with the pre-gait activity. Updated HEP for more core strength and postural strengthening. Mellody Dance will benefit from continued skilled PT to address ongoing deficits to improve mobility and activity tolerance to help reach the maximal level of functional independence and mobility.   OBJECTIVE IMPAIRMENTS: Abnormal gait, decreased activity tolerance, decreased balance, decreased coordination, decreased endurance, decreased knowledge of condition, decreased mobility, difficulty walking, decreased ROM, decreased strength, decreased safety awareness, hypomobility, increased fascial restrictions, impaired perceived functional ability, increased muscle spasms, impaired flexibility, improper body mechanics, postural dysfunction, and pain.   ACTIVITY LIMITATIONS: carrying, lifting, bending, standing, squatting,  stairs, transfers, reach over head, and locomotion level  PARTICIPATION LIMITATIONS: meal prep, cleaning, laundry, community activity, and yard work  PERSONAL FACTORS: Age, Fitness, Past/current experiences, Time since onset of injury/illness/exacerbation, and 3+ comorbidities: DM-II, diabetic peripheral neuropathy, HTN, HLD, B shoulder surgery  are also affecting patient's functional outcome.   REHAB POTENTIAL: Good  CLINICAL DECISION MAKING: Evolving/moderate complexity  EVALUATION COMPLEXITY: Moderate   GOALS: Goals reviewed with patient? Yes  SHORT TERM GOALS: Target date: 11/29/2023  Patient will be independent with initial HEP to improve outcomes and carryover.  Baseline:  Goal status: MET- 11/18/23  2.  Patient will be educated on strategies to decrease risk of falls.  Baseline:  Goal status: MET-11/18/23  3.  Patient will demonstrate decreased 5xSTS time to </= 12.6 sec to decrease risk for falls with transitional mobility. Baseline: 16.41 sec Goal status: IN PROGRESS  LONG TERM GOALS: Target date: 12/27/2023  Patient will be independent with advanced/ongoing HEP to facilitate ability to maintain/progress functional gains from skilled physical therapy services. Baseline:  Goal status: IN PROGRESS  2.  Patient will be able to ambulate 600' w/o LRAD on variable surfaces with good safety to access community.  Baseline:  Goal status: IN PROGRESS  3.  Patient will be able to step up/down curb safely with LRAD for safety with community ambulation.  Baseline:  Goal status: IN PROGRESS   4.  Patient will demonstrate improved proximal B LE strength to >/= 4+/5 for improved stability and ease of mobility . Baseline: Refer to above LE MMT table Goal status: IN PROGRESS  5.  Patient will improve Berg score to >/= 54/56 to improve safety and stability with ADLs in standing and reduce risk for falls.  Baseline: 50/56 (11/08/23) Goal status: REVISED   6.  Patient will improve  FGA score by at least 4 points (28/30) to improve gait stability and reduce risk for falls.  Baseline: 24/30 (11/08/23) Goal status: REVISED  7.   Patient will report </= 14% on Modified Oswestry to demonstrate improved function with decreased pain interference. Baseline: 13 / 50 = 26.0 % Goal status: IN PROGRESS  8. Patient will report >/=  90% on ABC scale to demonstrate improved balance confidence with functional mobility and gait. Baseline: 1280 / 1600 = 80.0 % Goal status: IN PROGRESS  9.  Patient to demonstrate ability to achieve and maintain good spinal alignment/posturing and body mechanics needed for daily activities. Baseline: Fwd flexed, fwd head and rounded shoulder posture Goal status: IN PROGRESS   PLAN:  PT FREQUENCY: 2x/week  PT DURATION: 8 weeks  PLANNED INTERVENTIONS: 97164- PT Re-evaluation, 97110-Therapeutic exercises, 97530- Therapeutic activity, 97112- Neuromuscular re-education, 97535- Self Care, 16109- Manual therapy, [phone]- Gait training, [phone]- Aquatic Therapy, [phone]- Electrical stimulation (unattended), 97035- Ultrasound, 29562- Traction (mechanical), Patient/Family education, Balance training, Stair training, Taping, Dry Needling, Joint mobilization, Spinal mobilization, Cryotherapy, Moist heat, and 97750- Physical Performance Test or Measurement   PLAN FOR NEXT SESSION:  progress lumbar extension ROM and proximal LE flexibility, esp hip flexors; lumbopelvic strengthening with glute emphasis; review & update/modify HEP PRN; MT +/- TPDN to address abnormal muscle tension/tightness   Darleene Cleaver, PTA 11/18/2023, 10:23 AM

## 2023-11-22 ENCOUNTER — Encounter: Payer: Self-pay | Admitting: Physical Therapy

## 2023-11-22 ENCOUNTER — Ambulatory Visit: Admitting: Physical Therapy

## 2023-11-22 DIAGNOSIS — M6281 Muscle weakness (generalized): Secondary | ICD-10-CM

## 2023-11-22 DIAGNOSIS — M5459 Other low back pain: Secondary | ICD-10-CM | POA: Diagnosis not present

## 2023-11-22 DIAGNOSIS — R293 Abnormal posture: Secondary | ICD-10-CM | POA: Diagnosis not present

## 2023-11-22 DIAGNOSIS — R2681 Unsteadiness on feet: Secondary | ICD-10-CM | POA: Diagnosis not present

## 2023-11-22 NOTE — Therapy (Signed)
 OUTPATIENT PHYSICAL THERAPY TREATMENT   Patient Name: Valgene Deloatch MRN: [id number] DOB:1951-05-03, 73 y.o., male Today's Date: 11/22/2023   END OF SESSION:  PT End of Session - 11/22/23 0932     Visit Number 6    Date for PT Re-Evaluation 12/27/23    Authorization Type [employer]    Progress Note Due on Visit 10    PT Start Time 0932    PT Stop Time 1016    PT Time Calculation (min) 44 min    Activity Tolerance Patient tolerated treatment well    Behavior During Therapy WFL for tasks assessed/performed                 Past Medical History:  Diagnosis Date   Allergy    "pollen"   Blood transfusion without reported diagnosis    with nasal sinus surgery   Cataract    BILATERAL,REMOVED   Diabetes mellitus without complication (HCC)    Fatty liver    "SOME EVIDENCE ON IMAGING EXAM"   Hyperlipidemia    Hypertension    Past Surgical History:  Procedure Laterality Date   ABDOMINAL SURGERY     scrapnel removed    CATARACTS Bilateral    COLONOSCOPY  03/17/2005   Ovidio Kin, polyp   EYE SURGERY     x 7 removed metal from eyes - shrapnel   HEMORROIDECTOMY     left shoulder surgery     NASAL SINUS SURGERY     POLYPECTOMY     right shoulder surgery     Patient Active Problem List   Diagnosis Date Noted   COVID-19 03/17/2021   Toe injury, left, initial encounter 06/12/2019   DYSPNEA ON EXERTION 07/27/2008   ELECTROCARDIOGRAM, ABNORMAL 07/27/2008   DIABETES-TYPE 2 05/07/2008   Hyperlipidemia 05/07/2008   ED (erectile dysfunction) 05/07/2008   HYPERTENSION, BENIGN ESSENTIAL 05/07/2008    PCP: Olive Bass, FNP   REFERRING PROVIDER: Olive Bass, FNP  REFERRING DIAG:  M54.50,G89.29 (ICD-10-CM) - Chronic low back pain without sciatica, unspecified back pain laterality  R26.89 (ICD-10-CM) - Balance problem  R29.3 (ICD-10-CM) - Poor posture   THERAPY DIAG:  No diagnosis found.  RATIONALE FOR EVALUATION AND TREATMENT:  Rehabilitation  ONSET DATE: ~4 yrs  NEXT MD VISIT: 04/11/24   SUBJECTIVE:                                                                                                                                                                                                         SUBJECTIVE STATEMENT: Pt denies pain  today.  Tenderness from slip off stool last week is resolving but still feels deep bruise.  Eval: Pt reports he finds himself stooping more and more. Feels top heavy and finds himself tripping easily. Also notes more limited endurance - can walk as far as he used to.  Fwd posture contributes to increased back pain.   Pt accompanied by: self  PAIN: Are you having pain? Yes: NPRS scale: 0/10 now, tightness in glutes and low back, hips Pain location: B lower back  Pain description: stiff, sore  Aggravating factors: working in the garden, stairs/steps  Relieving factors: sleep, sitting erect, heating pad   PERTINENT HISTORY:  DM-II, diabetic peripheral neuropathy, HTN, HLD, B shoulder surgery  PRECAUTIONS: Fall  RED FLAGS: None  WEIGHT BEARING RESTRICTIONS: No  FALLS:  Has patient fallen in last 6 months? Yes. Number of falls 1  LIVING ENVIRONMENT: Lives with: lives with their spouse Lives in: House/apartment Stairs: Yes: Internal: 14 steps; on right going up and on left going up and External: 4 steps; on right going up, on left going up, and can reach both Has following equipment at home:  elevated commode, walk-in shower  OCCUPATION: Retired  PLOF: Independent and Leisure: gardening - greenhouse, yard work, reading   PATIENT GOALS: "Better posture."   OBJECTIVE: (objective measures completed at initial evaluation unless otherwise dated)  DIAGNOSTIC FINDINGS:  N/A  COGNITION: Overall cognitive status: Within functional limits for tasks assessed   SENSATION: WFL Except diabetic neuropathy in B feet  POSTURE:  rounded shoulders, forward head, decreased  lumbar lordosis, increased thoracic kyphosis, and flexed trunk   PALPATION: Increased muscle tension/tightness in lumbar paraspinals, glutes, piriformis and hip flexors with TTP in upper glutes   LUMBAR ROM:   Active  Eval  Flexion WFL  Extension 75% limited  Right lateral flexion WFL  Left lateral flexion WFL  Right rotation WFL  Left rotation WFL   (Blank rows = not tested)   MUSCLE LENGTH: Hamstrings: mild tight B ITB: mod tight B Piriformis: severe tight B Hip flexors: severe tight B Quads: mod/severe tight B Heelcord:   LOWER EXTREMITY ROM:    Limited B hip IR>ER  LOWER EXTREMITY MMT:    MMT Right eval Left eval  Hip flexion 4+ 4+  Hip extension 4- * 4- *  Hip abduction 4- 4-  Hip adduction 4 4  Hip internal rotation 4+ *  4+ *  Hip external rotation 4+ 4+  Knee flexion 5 5  Knee extension 5 5  Ankle dorsiflexion 4+ 4  Ankle plantarflexion    Ankle inversion    Ankle eversion    (Blank rows = not tested, * - for available ROM)  BED MOBILITY:  Mod independent  TRANSFERS: Assistive device utilized: None  Sit to stand: Complete Independence Stand to sit: Complete Independence Chair to chair:    Floor:     GAIT: Distance walked: clinic distances Assistive device utilized: None Level of assistance: Complete Independence Gait pattern: decreased stride length and trunk flexed Comments:   FUNCTIONAL TESTS: (Remaining tests to be assessed next visit) 5 times sit to stand: 16.41 sec w/o UE assist - tendency for shift to R LE Timed up and go (TUG): 10.16 sec 10 meter walk test: 9.81 sec; Gait speed = 3.34 ft/sec Berg Balance Scale: 50/56, scores of 46-51 indicate moderate (>50%) risk for falls (11/08/23) Functional gait assessment: 24/30, scores of 19-24 indicate medium risk for falls (11/08/23)  11/22/23: 5xSTS = 12.31 sec  PATIENT SURVEYS:  ABC scale 1280 / 1600 = 80.0 %, moderate level of physical functioning  Modified Oswestry 13 / 50 = 26.0 %,  moderate disability     TODAY'S TREATMENT:   11/22/23 THERAPEUTIC EXERCISE: To improve strength, endurance, and flexibility.  Demonstration, verbal and tactile cues throughout for technique.  NuStep - L6 x 6 min Chest opener/pec stretch + scap retraction leaning back into green Pball on wall 10 x 5"  NEUROMUSCULAR RE-EDUCATION: To improve balance, coordination, kinesthesia, posture, and proprioception.  Green Pball roll-up on wall + lean-in for postural stretch x 10 Standing GTB scap retraction + low angle horiz ABD 10 x 3" (limited by h/o L RCR) Standing GTB scap retraction + lat pull-down 10 x 3" Standing GTB scap retraction + W row 10 x 3" Fwd step-over & back weight shift 1/2 FR with heel strike on weight acceptance x 10 bil, cues for upright posture Fwd step + cross-body reach to cone atop 36" FR x 10 each side, then alternating sides x 10 PWR! Step (side step & turn with arms extended) x 10 bil, cues to keep head and chest upright 5xSTS = 12.31 sec   11/18/23 Therapeutic Exercise: to improve strength, ROM, flexibility, and endurance  Nustep L6x42min UE/LE Seated R KTOS and figure 4 stretch 2 x 30"  Seated hip up and over bean bag 2 x 10 bil  NEUROMUSCULAR RE-EDUCATION: To improve proprioception, balance, and kinesthesia.  Review of reducing fall risk and safety Standing shoulder extension blue TB x 20 Standing pallof press blue TB x 20 each way Fwd stepping with heel strike, weight shifting  then step back x 10 each side- many cues for posture and form Ladder walk-ups to the top for trunk extension 10x5"   11/15/23 Therapeutic Exercise: to improve strength, ROM, flexibility, and endurance  Nustep L6x27min UE/LE Standing lumbar extension x 5 Standing hip extension 2x10 B- cues to avoid leaning forward Standing hip abduction 2x10 B- cues to avoid rotating R hip, postural cues Ladder walk-ups to the top for trunk extension 10x5" LTR 5x10" B S/L clamshell GTB 2x10  bil  NEUROMUSCULAR RE-EDUCATION: To improve proprioception, balance, and kinesthesia. Standing rows + scap retraction x 20 GTB- cues to retract shoulder blades Standing shld ext + scap retraction x 12 GTB Pallof press GTB 2x10 each direction Bridge with GTB 2x10   PATIENT EDUCATION:  Education details: continue with current HEP  Person educated: Patient Education method: Explanation Education comprehension: verbalized understanding  HOME EXERCISE PROGRAM: Access Code: [email or web address] URL: [email or web address] Date: 11/18/2023 Prepared by: Verta Ellen  Exercises - Supine Piriformis Stretch with Foot on Ground  - 1-2 x daily - 7 x weekly - 3 reps - 30 sec hold - Supine Single Knee to Chest Stretch  - 1-2 x daily - 7 x weekly - 3 reps - 30 sec hold - Standing Hip Flexor Stretch  - 1-2 x daily - 7 x weekly - 3 reps - 30 sec hold - Standing Lumbar Extension at Wall - Forearms  - 1-2 x daily - 7 x weekly - 2 sets - 10 reps - 3 sec hold - Mid-Lower Cervical Extension SNAG with Strap  - 1-2 x daily - 7 x weekly - 2 sets - 10 reps - 3 sec hold - Doorway Pec Stretch at 90 Degrees Abduction  - 1-2 x daily - 7 x weekly - 3 reps - 30 sec hold - Supine Bridge with Resistance Band  - 1 x daily -  3-5 x weekly - 2 sets - 10 reps - 5 sec hold - Clam with Resistance  - 1 x daily - 3-5 x weekly - 2 sets - 10 reps - 3-5 sec hold - Shoulder extension with resistance - Neutral  - 1 x daily - 3-5 x weekly - 2-3 sets - 10 reps - Standing Anti-Rotation Press with Anchored Resistance  - 1 x daily - 3-5 x weekly - 2-3 sets - 10 reps   ASSESSMENT:  CLINICAL IMPRESSION: Ayaz "Mellody Dance" initially reports HEP going well but at end of session admits to having to "streamline" them as they can be "too much at once" - will plan to review and consolidate and/or adjust frequency for HEP next visit.  Mellody Dance continues to demonstrated flexed trunk posture, therefore focused on dynamic stretching for chest and  hip flexors as well as postural strengthening but some scapular exercises limited by L shoulder LOM following h/o RCR surgery.  Minor LOB requiring additional steps to correct noted during dynamic balance activities but no significant LOB. He has 2 scheduled visits remaining in his POC although cert orders active until 12/27/23 - when asked about scheduling additional visits, he stated he thinks he will be ready to transition to his HEP following the remaining 2 visits.  As such, will focus on HEP review/consolidation and LTG assessment over next 2 visits.  OBJECTIVE IMPAIRMENTS: Abnormal gait, decreased activity tolerance, decreased balance, decreased coordination, decreased endurance, decreased knowledge of condition, decreased mobility, difficulty walking, decreased ROM, decreased strength, decreased safety awareness, hypomobility, increased fascial restrictions, impaired perceived functional ability, increased muscle spasms, impaired flexibility, improper body mechanics, postural dysfunction, and pain.   ACTIVITY LIMITATIONS: carrying, lifting, bending, standing, squatting, stairs, transfers, reach over head, and locomotion level  PARTICIPATION LIMITATIONS: meal prep, cleaning, laundry, community activity, and yard work  PERSONAL FACTORS: Age, Fitness, Past/current experiences, Time since onset of injury/illness/exacerbation, and 3+ comorbidities: DM-II, diabetic peripheral neuropathy, HTN, HLD, B shoulder surgery  are also affecting patient's functional outcome.   REHAB POTENTIAL: Good  CLINICAL DECISION MAKING: Evolving/moderate complexity  EVALUATION COMPLEXITY: Moderate   GOALS: Goals reviewed with patient? Yes  SHORT TERM GOALS: Target date: 11/29/2023  Patient will be independent with initial HEP to improve outcomes and carryover.  Baseline:  Goal status: MET - 11/18/23  2.  Patient will be educated on strategies to decrease risk of falls.  Baseline:  Goal status: MET - 11/18/23  3.   Patient will demonstrate decreased 5xSTS time to </= 12.6 sec to decrease risk for falls with transitional mobility. Baseline: 16.41 sec Goal status: MET - 11/22/23 - 12.31 sec  LONG TERM GOALS: Target date: 12/27/2023  Patient will be independent with advanced/ongoing HEP to facilitate ability to maintain/progress functional gains from skilled physical therapy services. Baseline:  Goal status: IN PROGRESS  2.  Patient will be able to ambulate 600' w/o LRAD on variable surfaces with good safety to access community.  Baseline:  Goal status: IN PROGRESS  3.  Patient will be able to step up/down curb safely with LRAD for safety with community ambulation.  Baseline:  Goal status: IN PROGRESS   4.  Patient will demonstrate improved proximal B LE strength to >/= 4+/5 for improved stability and ease of mobility . Baseline: Refer to above LE MMT table Goal status: IN PROGRESS  5.  Patient will improve Berg score to >/= 54/56 to improve safety and stability with ADLs in standing and reduce risk for falls.  Baseline: 50/56 (11/08/23)  Goal status: REVISED   6.  Patient will improve FGA score by at least 4 points (28/30) to improve gait stability and reduce risk for falls.  Baseline: 24/30 (11/08/23) Goal status: REVISED  7.   Patient will report </= 14% on Modified Oswestry to demonstrate improved function with decreased pain interference. Baseline: 13 / 50 = 26.0 % Goal status: IN PROGRESS  8. Patient will report >/= 90% on ABC scale to demonstrate improved balance confidence with functional mobility and gait. Baseline: 1280 / 1600 = 80.0 % Goal status: IN PROGRESS  9.  Patient to demonstrate ability to achieve and maintain good spinal alignment/posturing and body mechanics needed for daily activities. Baseline: Fwd flexed, fwd head and rounded shoulder posture Goal status: IN PROGRESS   PLAN:  PT FREQUENCY: 2x/week  PT DURATION: 8 weeks  PLANNED INTERVENTIONS: [id number]- PT  Re-evaluation, 97110-Therapeutic exercises, 97530- Therapeutic activity, 97112- Neuromuscular re-education, 97535- Self Care, 16109- Manual therapy, [phone]- Gait training, [phone]- Aquatic Therapy, [phone]- Electrical stimulation (unattended), 97035- Ultrasound, 29562- Traction (mechanical), Patient/Family education, Balance training, Stair training, Taping, Dry Needling, Joint mobilization, Spinal mobilization, Cryotherapy, Moist heat, and 97750- Physical Performance Test or Measurement   PLAN FOR NEXT SESSION:  review & consolidate/modify frequency of HEP; progress lumbar extension ROM and proximal LE flexibility, esp hip flexors; lumbopelvic strengthening with glute emphasis; MT +/- TPDN to address abnormal muscle tension/tightness   Marry Guan, PT 11/22/2023, 10:47 AM

## 2023-11-25 ENCOUNTER — Ambulatory Visit

## 2023-11-25 DIAGNOSIS — R293 Abnormal posture: Secondary | ICD-10-CM

## 2023-11-25 DIAGNOSIS — R2681 Unsteadiness on feet: Secondary | ICD-10-CM

## 2023-11-25 DIAGNOSIS — M6281 Muscle weakness (generalized): Secondary | ICD-10-CM | POA: Diagnosis not present

## 2023-11-25 DIAGNOSIS — M5459 Other low back pain: Secondary | ICD-10-CM | POA: Diagnosis not present

## 2023-11-25 NOTE — Therapy (Signed)
 OUTPATIENT PHYSICAL THERAPY TREATMENT   Patient Name: Brian [name] MRN: [id number] DOB:Jan 18, 1951, 73 y.o., male Today's Date: 11/25/2023   END OF SESSION:  PT End of Session - 11/25/23 1016     Visit Number 7    Date for PT Re-Evaluation 12/27/23    Authorization Type [employer]    Progress Note Due on Visit 10    PT Start Time 0930    PT Stop Time 1015    PT Time Calculation (min) 45 min    Activity Tolerance Patient tolerated treatment well    Behavior During Therapy WFL for tasks assessed/performed                  Past Medical History:  Diagnosis Date   Allergy    "pollen"   Blood transfusion without reported diagnosis    with nasal sinus surgery   Cataract    BILATERAL,REMOVED   Diabetes mellitus without complication (HCC)    Fatty liver    "SOME EVIDENCE ON IMAGING EXAM"   Hyperlipidemia    Hypertension    Past Surgical History:  Procedure Laterality Date   ABDOMINAL SURGERY     scrapnel removed    CATARACTS Bilateral    COLONOSCOPY  03/17/2005   Ovidio Kin, polyp   EYE SURGERY     x 7 removed metal from eyes - shrapnel   HEMORROIDECTOMY     left shoulder surgery     NASAL SINUS SURGERY     POLYPECTOMY     right shoulder surgery     Patient Active Problem List   Diagnosis Date Noted   COVID-19 03/17/2021   Toe injury, left, initial encounter 06/12/2019   DYSPNEA ON EXERTION 07/27/2008   ELECTROCARDIOGRAM, ABNORMAL 07/27/2008   DIABETES-TYPE 2 05/07/2008   Hyperlipidemia 05/07/2008   ED (erectile dysfunction) 05/07/2008   HYPERTENSION, BENIGN ESSENTIAL 05/07/2008    PCP: Olive Bass, FNP   REFERRING PROVIDER: Olive Bass, FNP  REFERRING DIAG:  [phone] (ICD-10-CM) - Chronic low back pain without sciatica, unspecified back pain laterality  R26.89 (ICD-10-CM) - Balance problem  R29.3 (ICD-10-CM) - Poor posture   THERAPY DIAG:  Unsteadiness on feet  Abnormal posture  Muscle weakness  (generalized)  Other low back pain  RATIONALE FOR EVALUATION AND TREATMENT: Rehabilitation  ONSET DATE: ~4 yrs  NEXT MD VISIT: 04/11/24   SUBJECTIVE:  SUBJECTIVE STATEMENT: Pt reports he is doing good today.  Eval: Pt reports he finds himself stooping more and more. Feels top heavy and finds himself tripping easily. Also notes more limited endurance - can walk as far as he used to.  Fwd posture contributes to increased back pain.   Pt accompanied by: self  PAIN: Are you having pain? Yes: NPRS scale: 0/10 now, tightness in glutes and low back, hips Pain location: B lower back  Pain description: stiff, sore  Aggravating factors: working in the garden, stairs/steps  Relieving factors: sleep, sitting erect, heating pad   PERTINENT HISTORY:  DM-II, diabetic peripheral neuropathy, HTN, HLD, B shoulder surgery  PRECAUTIONS: Fall  RED FLAGS: None  WEIGHT BEARING RESTRICTIONS: No  FALLS:  Has patient fallen in last 6 months? Yes. Number of falls 1  LIVING ENVIRONMENT: Lives with: lives with their spouse Lives in: House/apartment Stairs: Yes: Internal: 14 steps; on right going up and on left going up and External: 4 steps; on right going up, on left going up, and can reach both Has following equipment at home:  elevated commode, walk-in shower  OCCUPATION: Retired  PLOF: Independent and Leisure: gardening - greenhouse, yard work, reading   PATIENT GOALS: "Better posture."   OBJECTIVE: (objective measures completed at initial evaluation unless otherwise dated)  DIAGNOSTIC FINDINGS:  N/A  COGNITION: Overall cognitive status: Within functional limits for tasks assessed   SENSATION: WFL Except diabetic neuropathy in B feet  POSTURE:  rounded shoulders, forward head,  decreased lumbar lordosis, increased thoracic kyphosis, and flexed trunk   PALPATION: Increased muscle tension/tightness in lumbar paraspinals, glutes, piriformis and hip flexors with TTP in upper glutes   LUMBAR ROM:   Active  Eval  Flexion WFL  Extension 75% limited  Right lateral flexion WFL  Left lateral flexion WFL  Right rotation WFL  Left rotation WFL   (Blank rows = not tested)   MUSCLE LENGTH: Hamstrings: mild tight B ITB: mod tight B Piriformis: severe tight B Hip flexors: severe tight B Quads: mod/severe tight B Heelcord:   LOWER EXTREMITY ROM:    Limited B hip IR>ER  LOWER EXTREMITY MMT:    MMT Right eval Left eval R 11/25/23 L 11/25/23  Hip flexion 4+ 4+    Hip extension 4- * 4- * 4- 4  Hip abduction 4- 4- 4 4+  Hip adduction 4 4 5- sitting 5- sitting  Hip internal rotation 4+ *  4+ *    Hip external rotation 4+ 4+    Knee flexion 5 5 5 5   Knee extension 5 5 5 5   Ankle dorsiflexion 4+ 4 5 5   Ankle plantarflexion      Ankle inversion      Ankle eversion      (Blank rows = not tested, * - for available ROM)  BED MOBILITY:  Mod independent  TRANSFERS: Assistive device utilized: None  Sit to stand: Complete Independence Stand to sit: Complete Independence Chair to chair:    Floor:     GAIT: Distance walked: clinic distances Assistive device utilized: None Level of assistance: Complete Independence Gait pattern: decreased stride length and trunk flexed Comments:   FUNCTIONAL TESTS: (Remaining tests to be assessed next visit) 5 times sit to stand: 16.41 sec w/o UE assist - tendency for shift to R LE Timed up and go (TUG): 10.16 sec 10 meter walk test: 9.81 sec; Gait speed = 3.34 ft/sec Berg Balance Scale: 50/56, scores of 46-51 indicate moderate (>50%) risk  for falls (11/08/23) Functional gait assessment: 24/30, scores of 19-24 indicate medium risk for falls (11/08/23)  11/22/23: 5xSTS = 12.31 sec  PATIENT SURVEYS:  ABC scale 1280 / 1600 =  80.0 %, moderate level of physical functioning  Modified Oswestry 13 / 50 = 26.0 %, moderate disability     TODAY'S TREATMENT:  11/25/23 THERAPEUTIC EXERCISE: To improve strength, endurance, and flexibility.  Demonstration, verbal and tactile cues throughout for technique.  NuStep - L6 x 6 min Tested LE strength Supine bridge blue TB 2x10 S/L clamshell blue TB x 10 bil LTR 5x10" both ways Supine figure 4 stretch 2x30" bil Supine hip ER/IR x 5 NEUROMUSCULAR RE-EDUCATION: To improve balance, coordination, kinesthesia, posture, and proprioception.  Trunk rotation GTB x 10 each way Standing scap retraction + ER GTB pulling upward x 10 Standing mid row GTB 2x10 Standing warrior pose arms straight up x 10  bil  11/22/23 THERAPEUTIC EXERCISE: To improve strength, endurance, and flexibility.  Demonstration, verbal and tactile cues throughout for technique.  NuStep - L6 x 6 min Chest opener/pec stretch + scap retraction leaning back into green Pball on wall 10 x 5"  NEUROMUSCULAR RE-EDUCATION: To improve balance, coordination, kinesthesia, posture, and proprioception.  Green Pball roll-up on wall + lean-in for postural stretch x 10 Standing GTB scap retraction + low angle horiz ABD 10 x 3" (limited by h/o L RCR) Standing GTB scap retraction + lat pull-down 10 x 3" Standing GTB scap retraction + W row 10 x 3" Fwd step-over & back weight shift 1/2 FR with heel strike on weight acceptance x 10 bil, cues for upright posture Fwd step + cross-body reach to cone atop 36" FR x 10 each side, then alternating sides x 10 PWR! Step (side step & turn with arms extended) x 10 bil, cues to keep head and chest upright 5xSTS = 12.31 sec   11/18/23 Therapeutic Exercise: to improve strength, ROM, flexibility, and endurance  Nustep L6x50min UE/LE Seated R KTOS and figure 4 stretch 2 x 30"  Seated hip up and over bean bag 2 x 10 bil  NEUROMUSCULAR RE-EDUCATION: To improve proprioception, balance, and  kinesthesia.  Review of reducing fall risk and safety Standing shoulder extension blue TB x 20 Standing pallof press blue TB x 20 each way Fwd stepping with heel strike, weight shifting  then step back x 10 each side- many cues for posture and form Ladder walk-ups to the top for trunk extension 10x5"   11/15/23 Therapeutic Exercise: to improve strength, ROM, flexibility, and endurance  Nustep L6x41min UE/LE Standing lumbar extension x 5 Standing hip extension 2x10 B- cues to avoid leaning forward Standing hip abduction 2x10 B- cues to avoid rotating R hip, postural cues Ladder walk-ups to the top for trunk extension 10x5" LTR 5x10" B S/L clamshell GTB 2x10 bil  NEUROMUSCULAR RE-EDUCATION: To improve proprioception, balance, and kinesthesia. Standing rows + scap retraction x 20 GTB- cues to retract shoulder blades Standing shld ext + scap retraction x 12 GTB Pallof press GTB 2x10 each direction Bridge with GTB 2x10   PATIENT EDUCATION:  Education details: continue with current HEP  Person educated: Patient Education method: Explanation Education comprehension: verbalized understanding  HOME EXERCISE PROGRAM: Access Code: [email or web address] URL: [email or web address] Date: 11/25/2023 Prepared by: Verta Ellen  Exercises - Supine Lower Trunk Rotation  - 1 x daily - 7 x weekly - 2 sets - 5 reps - 10 sec hold - Supine Piriformis Stretch with Foot on Ground  - 1-2  x daily - 7 x weekly - 3 reps - 30 sec hold - Supine Single Knee to Chest Stretch  - 1-2 x daily - 7 x weekly - 3 reps - 30 sec hold - Standing Hip Flexor Stretch  - 1-2 x daily - 7 x weekly - 3 reps - 30 sec hold - Standing Lumbar Extension at Wall - Forearms  - 1-2 x daily - 7 x weekly - 2 sets - 10 reps - 3 sec hold - Mid-Lower Cervical Extension SNAG with Strap  - 1-2 x daily - 7 x weekly - 2 sets - 10 reps - 3 sec hold - Doorway Pec Stretch at 90 Degrees Abduction  - 1-2 x daily - 7 x weekly - 3 reps - 30 sec  hold - Supine Bridge with Resistance Band  - 1 x daily - 3-5 x weekly - 2 sets - 10 reps - 5 sec hold - Clam with Resistance  - 1 x daily - 3-5 x weekly - 2 sets - 10 reps - 3-5 sec hold - Shoulder extension with resistance - Neutral  - 1 x daily - 3-5 x weekly - 2-3 sets - 10 reps - Standing Anti-Rotation Press with Anchored Resistance  - 1 x daily - 3-5 x weekly - 2-3 sets - 10 reps   ASSESSMENT:  CLINICAL IMPRESSION: Brian "Mellody Dance" shows improvement in his LE strength, still with more weakness in glute med and max. We progressed his HEP adding blue TB resistance to bridges and clamshells, we also added LTR to his HEP. He continues to show natural trunk flexion needed cues for upright posture throughout session. Focused mostly on HEP progression and modification today. Will continue focus on HEP review/consolidation and LTG assessment next visit.   OBJECTIVE IMPAIRMENTS: Abnormal gait, decreased activity tolerance, decreased balance, decreased coordination, decreased endurance, decreased knowledge of condition, decreased mobility, difficulty walking, decreased ROM, decreased strength, decreased safety awareness, hypomobility, increased fascial restrictions, impaired perceived functional ability, increased muscle spasms, impaired flexibility, improper body mechanics, postural dysfunction, and pain.   ACTIVITY LIMITATIONS: carrying, lifting, bending, standing, squatting, stairs, transfers, reach over head, and locomotion level  PARTICIPATION LIMITATIONS: meal prep, cleaning, laundry, community activity, and yard work  PERSONAL FACTORS: Age, Fitness, Past/current experiences, Time since onset of injury/illness/exacerbation, and 3+ comorbidities: DM-II, diabetic peripheral neuropathy, HTN, HLD, B shoulder surgery  are also affecting patient's functional outcome.   REHAB POTENTIAL: Good  CLINICAL DECISION MAKING: Evolving/moderate complexity  EVALUATION COMPLEXITY: Moderate   GOALS: Goals  reviewed with patient? Yes  SHORT TERM GOALS: Target date: 11/29/2023  Patient will be independent with initial HEP to improve outcomes and carryover.  Baseline:  Goal status: MET - 11/18/23  2.  Patient will be educated on strategies to decrease risk of falls.  Baseline:  Goal status: MET - 11/18/23  3.  Patient will demonstrate decreased 5xSTS time to </= 12.6 sec to decrease risk for falls with transitional mobility. Baseline: 16.41 sec Goal status: MET - 11/22/23 - 12.31 sec  LONG TERM GOALS: Target date: 12/27/2023  Patient will be independent with advanced/ongoing HEP to facilitate ability to maintain/progress functional gains from skilled physical therapy services. Baseline:  Goal status: IN PROGRESS  2.  Patient will be able to ambulate 600' w/o LRAD on variable surfaces with good safety to access community.  Baseline:  Goal status: IN PROGRESS  3.  Patient will be able to step up/down curb safely with LRAD for safety with community ambulation.  Baseline:  Goal status: IN PROGRESS   4.  Patient will demonstrate improved proximal B LE strength to >/= 4+/5 for improved stability and ease of mobility . Baseline: Refer to above LE MMT table Goal status: IN PROGRESS- 11/25/23 not met for hip abduction or extension  5.  Patient will improve Berg score to >/= 54/56 to improve safety and stability with ADLs in standing and reduce risk for falls.  Baseline: 50/56 (11/08/23) Goal status: REVISED   6.  Patient will improve FGA score by at least 4 points (28/30) to improve gait stability and reduce risk for falls.  Baseline: 24/30 (11/08/23) Goal status: REVISED  7.   Patient will report </= 14% on Modified Oswestry to demonstrate improved function with decreased pain interference. Baseline: 13 / 50 = 26.0 % Goal status: IN PROGRESS  8. Patient will report >/= 90% on ABC scale to demonstrate improved balance confidence with functional mobility and gait. Baseline: 1280 / 1600 = 80.0  % Goal status: IN PROGRESS  9.  Patient to demonstrate ability to achieve and maintain good spinal alignment/posturing and body mechanics needed for daily activities. Baseline: Fwd flexed, fwd head and rounded shoulder posture Goal status: IN PROGRESS   PLAN:  PT FREQUENCY: 2x/week  PT DURATION: 8 weeks  PLANNED INTERVENTIONS: 97164- PT Re-evaluation, 97110-Therapeutic exercises, 97530- Therapeutic activity, 97112- Neuromuscular re-education, 97535- Self Care, 16109- Manual therapy, [id number]- Gait training, [phone]- Aquatic Therapy, [phone]- Electrical stimulation (unattended), 97035- Ultrasound, 29562- Traction (mechanical), Patient/Family education, Balance training, Stair training, Taping, Dry Needling, Joint mobilization, Spinal mobilization, Cryotherapy, Moist heat, and 97750- Physical Performance Test or Measurement   PLAN FOR NEXT SESSION:  review & consolidate/modify frequency of HEP; finish LTG assessment   Aldwin Micalizzi L Shabana Armentrout, PTA 11/25/2023, 11:11 AM

## 2023-11-29 ENCOUNTER — Ambulatory Visit: Admitting: Physical Therapy

## 2023-11-29 ENCOUNTER — Encounter: Payer: Self-pay | Admitting: Physical Therapy

## 2023-11-29 DIAGNOSIS — M6281 Muscle weakness (generalized): Secondary | ICD-10-CM

## 2023-11-29 DIAGNOSIS — R2681 Unsteadiness on feet: Secondary | ICD-10-CM | POA: Diagnosis not present

## 2023-11-29 DIAGNOSIS — R293 Abnormal posture: Secondary | ICD-10-CM | POA: Diagnosis not present

## 2023-11-29 DIAGNOSIS — M5459 Other low back pain: Secondary | ICD-10-CM

## 2023-11-29 NOTE — Therapy (Signed)
 OUTPATIENT PHYSICAL THERAPY TREATMENT / DISCHARGE SUMMARY   Patient Name: Brian [name] MRN: [id number] DOB:[birth year], 73 y.o., male Today's Date: 11/29/2023   END OF SESSION:  PT End of Session - 11/29/23 0932     Visit Number 8    Date for PT Re-Evaluation 12/27/23    Authorization Type [employer]    Progress Note Due on Visit 10    PT Start Time 0932    PT Stop Time 1008    PT Time Calculation (min) 36 min    Activity Tolerance Patient tolerated treatment well    Behavior During Therapy WFL for tasks assessed/performed                   Past Medical History:  Diagnosis Date   Allergy    "pollen"   Blood transfusion without reported diagnosis    with nasal sinus surgery   Cataract    BILATERAL,REMOVED   Diabetes mellitus without complication (HCC)    Fatty liver    "SOME EVIDENCE ON IMAGING EXAM"   Hyperlipidemia    Hypertension    Past Surgical History:  Procedure Laterality Date   ABDOMINAL SURGERY     scrapnel removed    CATARACTS Bilateral    COLONOSCOPY  03/17/2005   Juanita Norlander, polyp   EYE SURGERY     x 7 removed metal from eyes - shrapnel   HEMORROIDECTOMY     left shoulder surgery     NASAL SINUS SURGERY     POLYPECTOMY     right shoulder surgery     Patient Active Problem List   Diagnosis Date Noted   COVID-19 03/17/2021   Toe injury, left, initial encounter 06/12/2019   DYSPNEA ON EXERTION 07/27/2008   ELECTROCARDIOGRAM, ABNORMAL 07/27/2008   DIABETES-TYPE 2 05/07/2008   Hyperlipidemia 05/07/2008   ED (erectile dysfunction) 05/07/2008   HYPERTENSION, BENIGN ESSENTIAL 05/07/2008    PCP: Adra Alanis, FNP   REFERRING PROVIDER: Adra Alanis, FNP  REFERRING DIAG:  [id number] (ICD-10-CM) - Chronic low back pain without sciatica, unspecified back pain laterality  R26.89 (ICD-10-CM) - Balance problem  R29.3 (ICD-10-CM) - Poor posture   THERAPY DIAG:  Unsteadiness on feet  Abnormal  posture  Muscle weakness (generalized)  Other low back pain  RATIONALE FOR EVALUATION AND TREATMENT: Rehabilitation  ONSET DATE: ~4 yrs  NEXT MD VISIT: 04/11/24   SUBJECTIVE:  SUBJECTIVE STATEMENT: Pt reports he is feeling pretty good - now feels better to stand to erect rather than leaning forward.  He feels ready to try transitioning to his HEP.  Eval: Pt reports he finds himself stooping more and more. Feels top heavy and finds himself tripping easily. Also notes more limited endurance - can walk as far as he used to.  Fwd posture contributes to increased back pain.   Pt accompanied by: self  PAIN: Are you having pain? No  PERTINENT HISTORY:  DM-II, diabetic peripheral neuropathy, HTN, HLD, B shoulder surgery  PRECAUTIONS: Fall  RED FLAGS: None  WEIGHT BEARING RESTRICTIONS: No  FALLS:  Has patient fallen in last 6 months? Yes. Number of falls 1  LIVING ENVIRONMENT: Lives with: lives with their spouse Lives in: House/apartment Stairs: Yes: Internal: 14 steps; on right going up and on left going up and External: 4 steps; on right going up, on left going up, and can reach both Has following equipment at home:  elevated commode, walk-in shower  OCCUPATION: Retired  PLOF: Independent and Leisure: gardening - greenhouse, yard work, reading   PATIENT GOALS: "Better posture."   OBJECTIVE: (objective measures completed at initial evaluation unless otherwise dated)  DIAGNOSTIC FINDINGS:  N/A  COGNITION: Overall cognitive status: Within functional limits for tasks assessed   SENSATION: WFL Except diabetic neuropathy in B feet  POSTURE:  rounded shoulders, forward head, decreased lumbar lordosis, increased thoracic kyphosis, and flexed trunk   PALPATION: Increased  muscle tension/tightness in lumbar paraspinals, glutes, piriformis and hip flexors with TTP in upper glutes   LUMBAR ROM:   Active  Eval  Flexion WFL  Extension 75% limited  Right lateral flexion WFL  Left lateral flexion WFL  Right rotation WFL  Left rotation WFL   (Blank rows = not tested)   MUSCLE LENGTH: Hamstrings: mild tight B ITB: mod tight B Piriformis: severe tight B Hip flexors: severe tight B Quads: mod/severe tight B Heelcord:   LOWER EXTREMITY ROM:    Limited B hip IR>ER  LOWER EXTREMITY MMT:    MMT Right eval Left eval R 11/25/23 L 11/25/23  Hip flexion 4+ 4+    Hip extension 4- * 4- * 4- 4  Hip abduction 4- 4- 4 4+  Hip adduction 4 4 5- sitting 5- sitting  Hip internal rotation 4+ *  4+ *    Hip external rotation 4+ 4+    Knee flexion 5 5 5 5   Knee extension 5 5 5 5   Ankle dorsiflexion 4+ 4 5 5   Ankle plantarflexion      Ankle inversion      Ankle eversion      (Blank rows = not tested, * - for available ROM)  BED MOBILITY:  Mod independent  TRANSFERS: Assistive device utilized: None  Sit to stand: Complete Independence Stand to sit: Complete Independence Chair to chair:    Floor:     GAIT: Distance walked: clinic distances Assistive device utilized: None Level of assistance: Complete Independence Gait pattern: decreased stride length and trunk flexed Comments:   FUNCTIONAL TESTS: (Remaining tests to be assessed next visit) 5 times sit to stand: 16.41 sec w/o UE assist - tendency for shift to R LE Timed up and go (TUG): 10.16 sec 10 meter walk test: 9.81 sec; Gait speed = 3.34 ft/sec Berg Balance Scale: 50/56, scores of 46-51 indicate moderate (>50%) risk for falls (11/08/23) Functional gait assessment: 24/30, scores of 19-24 indicate medium risk for falls (11/08/23)  11/22/23: 5xSTS = 12.31 sec  PATIENT SURVEYS:  ABC scale 1280 / 1600 = 80.0 %, moderate level of physical functioning  Modified Oswestry 13 / 50 = 26.0 %, moderate  disability     TODAY'S TREATMENT:   11/29/23 THERAPEUTIC EXERCISE: To improve strength and endurance.  Demonstration, verbal and tactile cues throughout for technique.  NuStep - L6 x 6 min  PHYSICAL PERFORMANCE TEST or MEASUREMENT: ABC Scale = 1480 / 1600 = 92.5 %; >80% indicates a high level of physical functioning Modified Oswestry = 3 / 50 = 6.0 %; 0-20% indicates minimal disability = 8.44 sec Gait speed = 3.89 ft/sec Berg = 53/56 (improved from 50/56 on eval); scores of 52-55 indicate a lower (> 25%) risk for falls  FGA = 29/30; scores >28/30 do no indicate an increased risk for falls  Berg Balance Test   Sit to Stand Able to stand without using hands and stabilize independently    Standing Unsupported Able to stand safely 2 minutes    Sitting with Back Unsupported but Feet Supported on Floor or Stool Able to sit safely and securely 2 minutes    Stand to Sit Sits safely with minimal use of hands    Transfers Able to transfer safely, minor use of hands    Standing Unsupported with Eyes Closed Able to stand 10 seconds safely    Standing Unsupported with Feet Together Able to place feet together independently and stand 1 minute safely    From Standing, Reach Forward with Outstretched Arm Can reach confidently >25 cm (10")    From Standing Position, Pick up Object from Floor Able to pick up shoe safely and easily    From Standing Position, Turn to Look Behind Over each Shoulder Looks behind from both sides and weight shifts well    Turn 360 Degrees Able to turn 360 degrees safely in 4 seconds or less    Standing Unsupported, Alternately Place Feet on Step/Stool Able to stand independently and safely and complete 8 steps in 20 seconds    Standing Unsupported, One Foot in Front Able to plae foot ahead of the other independently and hold 30 seconds    Standing on One Leg Able to lift leg independently and hold equal to or more than 3 seconds    Total Score 53    Berg comment: 52-55  lower (> 25%) risk for falls      Functional Gait  Assessment   Gait Level Surface Walks 20 ft in less than 5.5 sec, no assistive devices, good speed, no evidence for imbalance, normal gait pattern, deviates no more than 6 in outside of the 12 in walkway width.    Change in Gait Speed Able to smoothly change walking speed without loss of balance or gait deviation. Deviate no more than 6 in outside of the 12 in walkway width.    Gait with Horizontal Head Turns Performs head turns smoothly with no change in gait. Deviates no more than 6 in outside 12 in walkway width    Gait with Vertical Head Turns Performs head turns with no change in gait. Deviates no more than 6 in outside 12 in walkway width.    Gait and Pivot Turn Pivot turns safely within 3 sec and stops quickly with no loss of balance.    Step Over Obstacle Is able to step over 2 stacked shoe boxes taped together (9 in total height) without changing gait speed. No evidence of imbalance.  Gait with Narrow Base of Support Is able to ambulate for 10 steps heel to toe with no staggering.    Gait with Eyes Closed Walks 20 ft, no assistive devices, good speed, no evidence of imbalance, normal gait pattern, deviates no more than 6 in outside 12 in walkway width. Ambulates 20 ft in less than 7 sec.    Ambulating Backwards Walks 20 ft, no assistive devices, good speed, no evidence for imbalance, normal gait    Steps Alternating feet, must use rail.    Total Score 29    FGA comment: no increased fall risk        11/25/23 THERAPEUTIC EXERCISE: To improve strength, endurance, and flexibility.  Demonstration, verbal and tactile cues throughout for technique.  NuStep - L6 x 6 min Tested LE strength Supine bridge blue TB 2x10 S/L clamshell blue TB x 10 bil LTR 5x10" both ways Supine figure 4 stretch 2x30" bil Supine hip ER/IR x 5 NEUROMUSCULAR RE-EDUCATION: To improve balance, coordination, kinesthesia, posture, and proprioception.  Trunk  rotation GTB x 10 each way Standing scap retraction + ER GTB pulling upward x 10 Standing mid row GTB 2x10 Standing warrior pose arms straight up x 10  bil   11/22/23 THERAPEUTIC EXERCISE: To improve strength, endurance, and flexibility.  Demonstration, verbal and tactile cues throughout for technique.  NuStep - L6 x 6 min Chest opener/pec stretch + scap retraction leaning back into green Pball on wall 10 x 5"  NEUROMUSCULAR RE-EDUCATION: To improve balance, coordination, kinesthesia, posture, and proprioception.  Green Pball roll-up on wall + lean-in for postural stretch x 10 Standing GTB scap retraction + low angle horiz ABD 10 x 3" (limited by h/o L RCR) Standing GTB scap retraction + lat pull-down 10 x 3" Standing GTB scap retraction + W row 10 x 3" Fwd step-over & back weight shift 1/2 FR with heel strike on weight acceptance x 10 bil, cues for upright posture Fwd step + cross-body reach to cone atop 36" FR x 10 each side, then alternating sides x 10 PWR! Step (side step & turn with arms extended) x 10 bil, cues to keep head and chest upright 5xSTS = 12.31 sec   11/18/23 Therapeutic Exercise: to improve strength, ROM, flexibility, and endurance  Nustep L6x36min UE/LE Seated R KTOS and figure 4 stretch 2 x 30"  Seated hip up and over bean bag 2 x 10 bil  NEUROMUSCULAR RE-EDUCATION: To improve proprioception, balance, and kinesthesia.  Review of reducing fall risk and safety Standing shoulder extension blue TB x 20 Standing pallof press blue TB x 20 each way Fwd stepping with heel strike, weight shifting  then step back x 10 each side- many cues for posture and form Ladder walk-ups to the top for trunk extension 10x5"   11/15/23 Therapeutic Exercise: to improve strength, ROM, flexibility, and endurance  Nustep L6x92min UE/LE Standing lumbar extension x 5 Standing hip extension 2x10 B- cues to avoid leaning forward Standing hip abduction 2x10 B- cues to avoid rotating R hip,  postural cues Ladder walk-ups to the top for trunk extension 10x5" LTR 5x10" B S/L clamshell GTB 2x10 bil  NEUROMUSCULAR RE-EDUCATION: To improve proprioception, balance, and kinesthesia. Standing rows + scap retraction x 20 GTB- cues to retract shoulder blades Standing shld ext + scap retraction x 12 GTB Pallof press GTB 2x10 each direction Bridge with GTB 2x10   PATIENT EDUCATION:  Education details: continue with current HEP and recommended frequency for ongoing HEP at discharge to  prevent loss of gains achieved with PT  Person educated: Patient Education method: Explanation Education comprehension: verbalized understanding  HOME EXERCISE PROGRAM: Access Code: [email or web address] URL: [email or web address] Date: 11/25/2023 Prepared by: Braylin Clark  Exercises - Supine Lower Trunk Rotation  - 1 x daily - 7 x weekly - 2 sets - 5 reps - 10 sec hold - Supine Piriformis Stretch with Foot on Ground  - 1-2 x daily - 7 x weekly - 3 reps - 30 sec hold - Supine Single Knee to Chest Stretch  - 1-2 x daily - 7 x weekly - 3 reps - 30 sec hold - Standing Hip Flexor Stretch  - 1-2 x daily - 7 x weekly - 3 reps - 30 sec hold - Standing Lumbar Extension at Wall - Forearms  - 1-2 x daily - 7 x weekly - 2 sets - 10 reps - 3 sec hold - Mid-Lower Cervical Extension SNAG with Strap  - 1-2 x daily - 7 x weekly - 2 sets - 10 reps - 3 sec hold - Doorway Pec Stretch at 90 Degrees Abduction  - 1-2 x daily - 7 x weekly - 3 reps - 30 sec hold - Supine Bridge with Resistance Band  - 1 x daily - 3-5 x weekly - 2 sets - 10 reps - 5 sec hold - Clam with Resistance  - 1 x daily - 3-5 x weekly - 2 sets - 10 reps - 3-5 sec hold - Shoulder extension with resistance - Neutral  - 1 x daily - 3-5 x weekly - 2-3 sets - 10 reps - Standing Anti-Rotation Press with Anchored Resistance  - 1 x daily - 3-5 x weekly - 2-3 sets - 10 reps   ASSESSMENT:  CLINICAL IMPRESSION: [place] "Sylvester Evert" notes improvement in his  flexibility and posture - able to better stand upright.  Standardized balance testing revealing gains on both Berg and FGA with Berg score of 53/56 indicating a lower risk for falls and FGA score of 29/30 not indicating any fall risk.  Improved balance confidence also evident in ABC scale score of 92.5%.  Modified Oswestry reduced to 6% indicating only minimal disability.  All PT goals now met with exception of mild hip weakness and Sylvester Evert feels ready to transition to his HEP, therefore will proceed with discharge from physical therapy for this episode.  OBJECTIVE IMPAIRMENTS: Abnormal gait, decreased activity tolerance, decreased balance, decreased coordination, decreased endurance, decreased knowledge of condition, decreased mobility, difficulty walking, decreased ROM, decreased strength, decreased safety awareness, hypomobility, increased fascial restrictions, impaired perceived functional ability, increased muscle spasms, impaired flexibility, improper body mechanics, postural dysfunction, and pain.   ACTIVITY LIMITATIONS: carrying, lifting, bending, standing, squatting, stairs, transfers, reach over head, and locomotion level  PARTICIPATION LIMITATIONS: meal prep, cleaning, laundry, community activity, and yard work  PERSONAL FACTORS: Age, Fitness, Past/current experiences, Time since onset of injury/illness/exacerbation, and 3+ comorbidities: DM-II, diabetic peripheral neuropathy, HTN, HLD, B shoulder surgery  are also affecting patient's functional outcome.   REHAB POTENTIAL: Good  CLINICAL DECISION MAKING: Evolving/moderate complexity  EVALUATION COMPLEXITY: Moderate   GOALS: Goals reviewed with patient? Yes  SHORT TERM GOALS: Target date: 11/29/2023  Patient will be independent with initial HEP to improve outcomes and carryover.  Baseline:  Goal status: MET - 11/18/23  2.  Patient will be educated on strategies to decrease risk of falls.  Baseline:  Goal status: MET - 11/18/23  3.   Patient will demonstrate decreased 5xSTS time to </= 12.6 sec  to decrease risk for falls with transitional mobility. Baseline: 16.41 sec Goal status: MET - 11/22/23 - 12.31 sec  LONG TERM GOALS: Target date: 12/27/2023  Patient will be independent with advanced/ongoing HEP to facilitate ability to maintain/progress functional gains from skilled physical therapy services. Baseline:  Goal status: MET - 11/29/23   2.  Patient will be able to ambulate 600' w/o LRAD on variable surfaces with good safety to access community.  Baseline:  Goal status: MET - 11/29/23  3.  Patient will be able to step up/down curb safely with LRAD for safety with community ambulation.  Baseline:  Goal status: MET - 11/29/23   4.  Patient will demonstrate improved proximal B LE strength to >/= 4+/5 for improved stability and ease of mobility . Baseline: Refer to above LE MMT table Goal status: PARTIALLY MET - 11/25/23 - not met for hip abduction or extension  5.  Patient will improve Berg score to >/= 54/56 to improve safety and stability with ADLs in standing and reduce risk for falls.  Baseline: 50/56 (11/08/23) Goal status: PARTIALLY MET - 11/29/23 - 53/56  6.  Patient will improve FGA score by at least 4 points (28/30) to improve gait stability and reduce risk for falls.  Baseline: 24/30 (11/08/23) Goal status: MET - 11/29/23 - 29/30  7.   Patient will report </= 14% on Modified Oswestry to demonstrate improved function with decreased pain interference. Baseline: 13 / 50 = 26.0 % Goal status: MET - 11/29/23 - 3 / 50 = 6.0 %  8. Patient will report >/= 90% on ABC scale to demonstrate improved balance confidence with functional mobility and gait. Baseline: 1280 / 1600 = 80.0 % Goal status: MET - 11/29/23 - 1480 / 1600 = 92.5 %  9.  Patient to demonstrate ability to achieve and maintain good spinal alignment/posturing and body mechanics needed for daily activities. Baseline: Fwd flexed, fwd head and rounded shoulder  posture Goal status: MET - 11/29/23 - Pt reports improving ability to achieve better upright posture  PLAN:  PT FREQUENCY: 2x/week  PT DURATION: 8 weeks  PLANNED INTERVENTIONS: 47829- PT Re-evaluation, 97110-Therapeutic exercises, 97530- Therapeutic activity, 97112- Neuromuscular re-education, 97535- Self Care, 56213- Manual therapy, [phone]- Gait training, [phone]- Aquatic Therapy, [phone]- Electrical stimulation (unattended), 97035- Ultrasound, 41324- Traction (mechanical), Patient/Family education, Balance training, Stair training, Taping, Dry Needling, Joint mobilization, Spinal mobilization, Cryotherapy, Moist heat, and 97750- Physical Performance Test or Measurement   PLAN FOR NEXT SESSION:  transition to HEP + D/C from PT   PHYSICAL THERAPY DISCHARGE SUMMARY  Visits from Start of Care: 8  Current functional level related to goals / functional outcomes: Refer to above clinical impression and goal assessment.    Remaining deficits: Mild ongoing weakness in B hip extension and abduction - pt independent with ongoing strengthening HEP   Education / Equipment: HEP   Patient agrees to discharge. Patient goals were met or partially met. Patient is being discharged due to being pleased with the current functional level.    Francisco Irving, PT 11/29/2023, 10:11 AM

## 2024-01-04 DIAGNOSIS — L821 Other seborrheic keratosis: Secondary | ICD-10-CM | POA: Diagnosis not present

## 2024-01-06 ENCOUNTER — Other Ambulatory Visit: Payer: Self-pay | Admitting: Family

## 2024-01-26 ENCOUNTER — Other Ambulatory Visit: Payer: Self-pay | Admitting: Family

## 2024-02-13 ENCOUNTER — Other Ambulatory Visit: Payer: Self-pay | Admitting: Family

## 2024-04-01 ENCOUNTER — Other Ambulatory Visit: Payer: Self-pay | Admitting: Family

## 2024-04-03 ENCOUNTER — Other Ambulatory Visit: Payer: Self-pay | Admitting: Family

## 2024-04-11 ENCOUNTER — Encounter: Payer: Self-pay | Admitting: Family

## 2024-04-11 ENCOUNTER — Ambulatory Visit: Payer: Self-pay | Admitting: Family

## 2024-04-11 ENCOUNTER — Ambulatory Visit: Payer: Medicare HMO | Admitting: Family

## 2024-04-11 ENCOUNTER — Other Ambulatory Visit: Payer: Self-pay | Admitting: Family

## 2024-04-11 VITALS — BP 128/64 | HR 85 | Ht 73.0 in | Wt 270.4 lb

## 2024-04-11 DIAGNOSIS — E1169 Type 2 diabetes mellitus with other specified complication: Secondary | ICD-10-CM | POA: Diagnosis not present

## 2024-04-11 DIAGNOSIS — Z794 Long term (current) use of insulin: Secondary | ICD-10-CM

## 2024-04-11 DIAGNOSIS — R899 Unspecified abnormal finding in specimens from other organs, systems and tissues: Secondary | ICD-10-CM

## 2024-04-11 DIAGNOSIS — Z7984 Long term (current) use of oral hypoglycemic drugs: Secondary | ICD-10-CM | POA: Diagnosis not present

## 2024-04-11 LAB — COMPREHENSIVE METABOLIC PANEL WITH GFR
ALT: 35 U/L (ref 0–53)
AST: 48 U/L — ABNORMAL HIGH (ref 0–37)
Albumin: 4.5 g/dL (ref 3.5–5.2)
Alkaline Phosphatase: 77 U/L (ref 39–117)
BUN: 30 mg/dL — ABNORMAL HIGH (ref 6–23)
CO2: 27 meq/L (ref 19–32)
Calcium: 9.6 mg/dL (ref 8.4–10.5)
Chloride: 97 meq/L (ref 96–112)
Creatinine, Ser: 1.46 mg/dL (ref 0.40–1.50)
GFR: 46.86 mL/min — ABNORMAL LOW (ref >60.00)
Glucose, Bld: 140 mg/dL — ABNORMAL HIGH (ref 70–99)
Potassium: 4.8 meq/L (ref 3.5–5.1)
Sodium: 135 meq/L (ref 135–145)
Total Bilirubin: 1.2 mg/dL (ref 0.2–1.2)
Total Protein: 7.5 g/dL (ref 6.0–8.3)

## 2024-04-11 LAB — CBC WITH DIFFERENTIAL/PLATELET
Basophils Absolute: 0 K/uL (ref 0.0–0.1)
Basophils Relative: 0.4 % (ref 0.0–3.0)
Eosinophils Absolute: 0.2 K/uL (ref 0.0–0.7)
Eosinophils Relative: 2.4 % (ref 0.0–5.0)
HCT: 41.7 % (ref 39.0–52.0)
Hemoglobin: 14.3 g/dL (ref 13.0–17.0)
Lymphocytes Relative: 13.6 % (ref 12.0–46.0)
Lymphs Abs: 1.1 K/uL (ref 0.7–4.0)
MCHC: 34.2 g/dL (ref 30.0–36.0)
MCV: 94.6 fl (ref 78.0–100.0)
Monocytes Absolute: 0.9 K/uL (ref 0.1–1.0)
Monocytes Relative: 10.9 % (ref 3.0–12.0)
Neutro Abs: 5.7 K/uL (ref 1.4–7.7)
Neutrophils Relative %: 72.7 % (ref 43.0–77.0)
Platelets: 276 K/uL (ref 150.0–400.0)
RBC: 4.4 Mil/uL (ref 4.22–5.81)
RDW: 13.5 % (ref 11.5–15.5)
WBC: 7.9 K/uL (ref 4.0–10.5)

## 2024-04-11 LAB — MICROALBUMIN / CREATININE URINE RATIO
Creatinine,U: 260.1 mg/dL
Microalb Creat Ratio: 48.8 mg/g — ABNORMAL HIGH (ref 0.0–30.0)
Microalb, Ur: 12.7 mg/dL — ABNORMAL HIGH (ref 0.0–1.9)

## 2024-04-11 LAB — HEMOGLOBIN A1C: Hgb A1c MFr Bld: 6.9 % — ABNORMAL HIGH (ref 4.6–6.5)

## 2024-04-11 NOTE — Patient Instructions (Signed)
 It is with a heavy heart that I have decided to leave [employer] at [facility].  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  Thank you for allowing me into your life and trusting me with your medical care.  I will miss you and my time at [facility].    I did not make this decision lightly. My last day will be May 05, 2024.    You will be in good hands with one of my Brewing technologist. You can set up an appointment with another provider at the [facility] location, or you can choose to establish care at one of the other [facility] offices.   [facility]- [address] [place] [address] Tel:[phone] [place]- [address]  [place], [place] [address] Tel: [phone] [facility]) - [address], [place], [place] [address] Tel: [phone] [place] - [address] [place], [place], [place] [address] Tel: [phone] [facility]- [address], [place], [place] [address] Tel: [phone] [facility]- [address] [place], [place] [address] Tel: [phone] [place] [facility] [address] [place], [place], [place] [address] Tel: [phone] [place]- [address] [place], [place] [address] Tel: [phone]   I am sorry for any inconvenience this may cause. Please Ambulance person with any questions or concerns that you may have.    Sincerely,    Leita Elbe, NP

## 2024-04-11 NOTE — Progress Notes (Signed)
 Brian [name] is a 73 y.o. male with the following history as recorded in [employer]:  Patient Active Problem List   Diagnosis Date Noted   COVID-19 03/17/2021   Toe injury, left, initial encounter 06/12/2019   DYSPNEA ON EXERTION 07/27/2008   ELECTROCARDIOGRAM, ABNORMAL 07/27/2008   DIABETES-TYPE 2 05/07/2008   Hyperlipidemia 05/07/2008   ED (erectile dysfunction) 05/07/2008   HYPERTENSION, BENIGN ESSENTIAL 05/07/2008    Current Outpatient Medications  Medication Sig Dispense Refill   allopurinol  (ZYLOPRIM ) 300 MG tablet Take 1 tablet by mouth once daily 90 tablet 3   aspirin EC 81 MG tablet Take 81 mg by mouth daily. Swallow whole.     atorvastatin  (LIPITOR) 20 MG tablet Take 1 tablet by mouth once daily 90 tablet 3   Azelastine  HCl 137 MCG/SPRAY SOLN Place 2 sprays into both nostrils 2 (two) times daily as needed. 90 mL 1   fluticasone  (FLONASE ) 50 MCG/ACT nasal spray Place 2 sprays into both nostrils daily. 48 g 0   gabapentin  (NEURONTIN ) 100 MG capsule TAKE 1 CAPSULE BY MOUTH DAILY IN THE MORNING AND 2 CAPSULES DAILY IN THE EVENING 270 capsule 0   glimepiride  (AMARYL ) 2 MG tablet Take 1 tablet by mouth twice daily 180 tablet 1   hydrochlorothiazide  (HYDRODIURIL ) 25 MG tablet Take 1 tablet by mouth once daily 90 tablet 1   lisinopril  (ZESTRIL ) 5 MG tablet Take 1 tablet (5 mg total) by mouth 2 (two) times daily. 180 tablet 1   metFORMIN  (GLUCOPHAGE ) 1000 MG tablet TAKE 1 TABLET BY MOUTH TWICE DAILY WITH MEALS 180 tablet 0   Multiple Vitamin (MULTIVITAMIN WITH MINERALS) TABS tablet Take 1 tablet by mouth daily.     OVER THE COUNTER MEDICATION CLEAR EYES FOR REDNESS AS NEEDED     sildenafil  (REVATIO ) 20 MG tablet TAKE 3-5 TABLETS BY MOUTH AS DIRECTED PRIOR TO INTERCOURSE 50 tablet 0   vitamin B-12 (CYANOCOBALAMIN ) 1000 MCG tablet Take 1,000 mcg by mouth daily.     levocetirizine (XYZAL ) 5 MG tablet TAKE 1 TABLET BY MOUTH ONCE DAILY IN THE EVENING (Patient not taking: Reported on  04/11/2024) 90 tablet 3   No current facility-administered medications for this visit.    Allergies: Patient has no known allergies.  Past Medical History:  Diagnosis Date   Allergy    pollen   Blood transfusion without reported diagnosis    with nasal sinus surgery   Cataract    BILATERAL,REMOVED   Diabetes mellitus without complication (HCC)    Fatty liver    SOME EVIDENCE ON IMAGING EXAM   Hyperlipidemia    Hypertension     Past Surgical History:  Procedure Laterality Date   ABDOMINAL SURGERY     scrapnel removed    CATARACTS Bilateral    COLONOSCOPY  03/17/2005   Alm Angle, polyp   EYE SURGERY     x 7 removed metal from eyes - shrapnel   HEMORROIDECTOMY     left shoulder surgery     NASAL SINUS SURGERY     POLYPECTOMY     right shoulder surgery      Family History  Problem Relation Age of Onset   Diabetes Sister    Heart disease Brother    Colon cancer Neg Hx    Colon polyps Neg Hx    Esophageal cancer Neg Hx    Rectal cancer Neg Hx    Stomach cancer Neg Hx    Celiac disease Neg Hx    Crohn's disease Neg  Hx     Social History   Tobacco Use   Smoking status: Former    Current packs/day: 0.00    Average packs/day: 5.0 packs/day for 4.0 years (20.0 ttl pk-yrs)    Types: Cigarettes    Start date: 08/17/1982    Quit date: 08/17/1986    Years since quitting: 37.6    Passive exposure: Never   Smokeless tobacco: Never  Substance Use Topics   Alcohol use: Yes    Alcohol/week: 10.0 standard drinks of alcohol    Types: 10 Glasses of wine per week    Comment: SEVERAL TIMES A WEEK WITH MEALS    Subjective:   Patient presents for 6 month follow up on chronic care needs; doing well with no concerns; has lost 9 pounds since last OV;   He notes that he did work very hard in the yard yesterday and was concerned that about how tired he was/ light was bothering my eyes when I stopped working; no chest pain or shortness of breath on exertion; he notes he felt  better once he was able to rest/ re-hydrate/ take a shower;     Objective:  Vitals:   04/11/24 0924  BP: 128/64  Pulse: 85  SpO2: 93%  Weight: 270 lb 6.4 oz (122.7 kg)  Height: 6' 1 (1.854 m)    General: Well developed, well nourished, in no acute distress  Skin : Warm and dry.  Head: Normocephalic and atraumatic  Lungs: Respirations unlabored; clear to auscultation bilaterally without wheeze, rales, rhonchi  CVS exam: normal rate and regular rhythm.  Neurologic: Alert and oriented; speech intact; face symmetrical; moves all extremities well; CNII-XII intact without focal deficit   Assessment:  1. Type 2 diabetes mellitus with other specified complication, without long-term current use of insulin (HCC)   2. Diabetes mellitus treated with insulin and oral medication (HCC)     Plan:   Will update labs today; continue current regimen;  Based on description of symptoms that patient described while doing yardwork, I did ask patient to consider seeing cardiology to make sure he did not have some type of anginal event. He defers and notes he does not want to see anyone at this time and will call back if it happens again. Patient is given a copy of the letter to let him know that I will be leaving at the end of September and he will need to find a new PCP.   Return in about 6 months (around 10/12/2024).  Orders Placed This Encounter  Procedures   CBC with Differential/Platelet   Comp Met (CMET)   Hemoglobin A1c   Urine Microalbumin w/creat. ratio    Requested Prescriptions    No prescriptions requested or ordered in this encounter

## 2024-04-19 ENCOUNTER — Encounter: Payer: Self-pay | Admitting: Podiatry

## 2024-04-19 ENCOUNTER — Ambulatory Visit: Admitting: Podiatry

## 2024-04-19 DIAGNOSIS — L600 Ingrowing nail: Secondary | ICD-10-CM | POA: Diagnosis not present

## 2024-04-19 MED ORDER — MUPIROCIN 2 % EX OINT: 1.0000 | TOPICAL_OINTMENT | Freq: Two times a day (BID) | CUTANEOUS | 0 refills | Status: DC

## 2024-04-19 MED ORDER — AMOXICILLIN-POT CLAVULANATE 875-125 MG PO TABS: 1.0000 | ORAL_TABLET | Freq: Two times a day (BID) | ORAL | 0 refills | Status: AC

## 2024-04-19 NOTE — Progress Notes (Signed)
 Patient presents with area of blistering and redness around the nail on the hallux right.  He noticed it after work 1 day.  Is not sure if it rubbed against something or if he dropped something on it.  He has neuropathy.  No fever or chills or nausea or vomiting.   Physical exam:  General appearance: Pleasant, and in no acute distress. AOx3.  Vascular: Pedal pulses: DP 2/4 bilaterally, PT 2/4 bilaterally.  Mild to moderate edema hallux right. Capillary fill time immediate right.  Neurological: Decrease sensation in his foot with neuropathy.  No pain in wound site or around nail.  Dermatologic:   Blister and granulation tissue along the proximal nail fold especially on the medial portion of the proximal nail fold.  Clear drainage at wound site.  Erythema along the nail edges hallux right..  Musculoskeletal:     Diagnosis: 1 ingrown nail with cellulitis hallux right.  Plan: -Established office visit for evaluation and management.  Level 3. - I discussed with him the ingrown nail and the blistering and wound at the proximal nail fold.  Will have him do local wound care with oral and topical antibiotics as well as soaking it.  Will check the toe out in a week and see how it looks.  Explained to him that the nail might need to be avulsed. -Wound care: Soak foot twice daily warm Epsom salt water for 15 minutes, apply Bactroban  ointment, and a light dressing. - Rx Augmentin  875 mg, 1 p.o. twice daily for 10 days - Rx Bactroban  ointment to apply twice daily to wound, refill x 2 -Swab culture taken and sent for evaluation.   Return 1 week follow-up ingrown nail hallux right

## 2024-04-19 NOTE — Patient Instructions (Signed)

## 2024-04-19 NOTE — Addendum Note (Signed)
 Addended by: GIB MABLE SAUNDERS on: 04/19/2024 07:48 PM   Modules accepted: Orders

## 2024-04-21 ENCOUNTER — Ambulatory Visit: Admitting: Family

## 2024-04-23 LAB — WOUND CULTURE
MICRO NUMBER:: 16923182
SPECIMEN QUALITY:: ADEQUATE

## 2024-04-24 ENCOUNTER — Other Ambulatory Visit

## 2024-04-24 ENCOUNTER — Other Ambulatory Visit: Payer: Self-pay | Admitting: Family

## 2024-04-24 DIAGNOSIS — E113393 Type 2 diabetes mellitus with moderate nonproliferative diabetic retinopathy without macular edema, bilateral: Secondary | ICD-10-CM | POA: Diagnosis not present

## 2024-04-24 DIAGNOSIS — Z7984 Long term (current) use of oral hypoglycemic drugs: Secondary | ICD-10-CM | POA: Diagnosis not present

## 2024-04-24 DIAGNOSIS — Z961 Presence of intraocular lens: Secondary | ICD-10-CM | POA: Diagnosis not present

## 2024-04-24 DIAGNOSIS — H04123 Dry eye syndrome of bilateral lacrimal glands: Secondary | ICD-10-CM | POA: Diagnosis not present

## 2024-04-24 DIAGNOSIS — H524 Presbyopia: Secondary | ICD-10-CM | POA: Diagnosis not present

## 2024-04-24 DIAGNOSIS — H26493 Other secondary cataract, bilateral: Secondary | ICD-10-CM | POA: Diagnosis not present

## 2024-04-24 LAB — HM DIABETES EYE EXAM

## 2024-04-25 ENCOUNTER — Other Ambulatory Visit: Payer: Self-pay | Admitting: Family

## 2024-04-25 ENCOUNTER — Other Ambulatory Visit (INDEPENDENT_AMBULATORY_CARE_PROVIDER_SITE_OTHER)

## 2024-04-25 DIAGNOSIS — R899 Unspecified abnormal finding in specimens from other organs, systems and tissues: Secondary | ICD-10-CM | POA: Diagnosis not present

## 2024-04-25 LAB — COMPREHENSIVE METABOLIC PANEL WITH GFR
ALT: 33 U/L (ref 0–53)
AST: 36 U/L (ref 0–37)
Albumin: 4.3 g/dL (ref 3.5–5.2)
Alkaline Phosphatase: 74 U/L (ref 39–117)
BUN: 17 mg/dL (ref 6–23)
CO2: 30 meq/L (ref 19–32)
Calcium: 9.8 mg/dL (ref 8.4–10.5)
Chloride: 100 meq/L (ref 96–112)
Creatinine, Ser: 0.94 mg/dL (ref 0.40–1.50)
GFR: 79.46 mL/min (ref >60.00)
Glucose, Bld: 155 mg/dL — ABNORMAL HIGH (ref 70–99)
Potassium: 5.4 meq/L — ABNORMAL HIGH (ref 3.5–5.1)
Sodium: 136 meq/L (ref 135–145)
Total Bilirubin: 1 mg/dL (ref 0.2–1.2)
Total Protein: 7.1 g/dL (ref 6.0–8.3)

## 2024-04-26 ENCOUNTER — Encounter: Payer: Self-pay | Admitting: Podiatry

## 2024-04-26 ENCOUNTER — Ambulatory Visit: Payer: Self-pay | Admitting: Family

## 2024-04-26 ENCOUNTER — Ambulatory Visit: Admitting: Podiatry

## 2024-04-26 DIAGNOSIS — L03031 Cellulitis of right toe: Secondary | ICD-10-CM | POA: Diagnosis not present

## 2024-04-26 NOTE — Progress Notes (Signed)
 Presents today for follow-up ingrown nail on the hallux right.  Says is doing better much less drainage send denies any purulence.  Still red and a little bit sore but much better than before.  No fever chills nausea or vomiting   Physical exam:  General appearance: Pleasant, and in no acute distress. AOx3.  Vascular: Pedal pulses: DP 2/4 bilaterally, PT 2/4 bilaterally.  Mild edema lower legs bilaterally. Capillary fill time immediate bilaterally.  Neurological: Grossly intact bilaterally Dermatologic:   Skin normal temperature bilaterally.  Skin normal color, tone, and texture bilaterally.   Musculoskeletal: Tenderness hallux right    Diagnosis: 1.  Paronychia hallux right  Plan: -Established office visit for evaluation and management level 3. -Infection appears to have resolved over the still a lot of irritation along the nail fold.  Continue wound care and see how it does over the next 2 weeks.  If there is resolved and the inflammation along the proximal nail fold is resolved we will not need to do anything however it still irritated that I would recommend avulsion of the nail with permanent removal of the borders - Continue soaking twice daily for 15 minutes warm Epsom salt water, apply Bactroban  ointment, and apply a light dressing.   Return 2 weeks follow-up paronychia hallux right

## 2024-05-10 ENCOUNTER — Ambulatory Visit: Admitting: Podiatry

## 2024-05-10 ENCOUNTER — Encounter: Payer: Self-pay | Admitting: Podiatry

## 2024-05-10 DIAGNOSIS — L600 Ingrowing nail: Secondary | ICD-10-CM | POA: Diagnosis not present

## 2024-05-10 NOTE — Patient Instructions (Signed)

## 2024-05-10 NOTE — Progress Notes (Signed)
 Patient complains of painful ingrown both border(s) hallux right still pain-free after soaking and doing local wound care for the past few weeks.  Still gets a little bit of clear drainage but no purulence noted.. Patient denies fevers, chills, nausea, vomiting.  Objective:  Vitals: Reviewed  General: Well developed, nourished, in no acute distress, alert and oriented x3   Vascular: DP pulse 2/4 bilateral. PT pulse 2/4 bilateral.  Mild edema hallux right.  Dermatology: Erythema, edema, incurvated nail border hallux nail right with clear drainage . Tenderness present with palpation. Normal skin tone and texture feet with normal hair growth.  Neurological: Grossly intact. Normal reflexes.   Musculoskeletal: Tenderness with palpation of the distal hallux right. No tenderness or painful ROM at IPJ.  Diagnosis: Ingrown nail both borders hallux right  Plan: -discussed etiology and treatment of ingrown nails. Discussed surgical vs conservative treatment.  We will avulse the entire nail plate however perform matrixectomy is only on the borders hallux right -Consent signed for appropriate matrixectomy affected nail(s).  Procedure(s):   - Matrixectomy(s) both borders hallux right: Toe anesthetized with 3cc 2:1 mixture 2% Lidocaine with epinephrine: Sodium Bicarbonate. Surgical site prepped. Digital tourniquet applied.  Avulsion of nail plate performed. Matrixecomy performed with three 30 second applications of phenol to nail matrix at both borders of the hallux.  Was irrigated with alcohol.  Tourniquet released with good vascularity noticed in digit.  Applied triple antibiotic to nailbed and applied gauze and Coban dressing. - Written and oral postoperative instructions given.  -Return for post-op 2 weeks.  JINNY Prentice Binder, DPM

## 2024-05-24 ENCOUNTER — Ambulatory Visit: Admitting: Podiatry

## 2024-05-24 DIAGNOSIS — L02611 Cutaneous abscess of right foot: Secondary | ICD-10-CM | POA: Diagnosis not present

## 2024-05-24 DIAGNOSIS — L03031 Cellulitis of right toe: Secondary | ICD-10-CM

## 2024-05-24 MED ORDER — AMOXICILLIN-POT CLAVULANATE 875-125 MG PO TABS: 1.0000 | ORAL_TABLET | Freq: Two times a day (BID) | ORAL | 0 refills | Status: AC

## 2024-05-24 NOTE — Progress Notes (Signed)
 Patient presents several weeks status post nail surgery hallux right.  So getting drainage had increased redness and drainage over the past few days.  No fever chills nausea or vomiting.  Has been soaking it twice a day.   Physical exam:  General appearance: Pleasant, and in no acute distress. AOx3.  Vascular: Pedal pulses: DP 2/4 bilaterally, PT 2/4 bilaterally. Mild edema hallux right.  Capillary refill time immediate hallux right gross  Neurological: Grossly intact bilaterally   Dermatologic:   Redness and swelling along the medial and lateral borders of the proximal nail fold.  Redness extends along the entire proximal nail fold.  With pressure on the area there is some serous drainage.  Much more inflamed than usual after matricectomy procedure with phenol skin normal temperature bilaterally.  Skin normal color, tone, and texture bilaterally.   Musculoskeletal: Tenderness hallux right distally    Diagnosis: 1.  Cellulitis great toe right  Plan: -Established office visit for evaluation and management level 3. - Discussed the redness and some of the drainage at the nail still.  Could be developing a cellulitis we will place him on Augmentin  to treat this.  Continue soaking twice daily warm Epsom salt water, apply antibiotic ointment, and a light dressing. -Should be healed  Return as needed

## 2024-05-26 ENCOUNTER — Other Ambulatory Visit: Payer: Self-pay | Admitting: Podiatry

## 2024-05-26 DIAGNOSIS — L600 Ingrowing nail: Secondary | ICD-10-CM

## 2024-07-06 DIAGNOSIS — L570 Actinic keratosis: Secondary | ICD-10-CM | POA: Diagnosis not present

## 2024-07-07 ENCOUNTER — Other Ambulatory Visit: Payer: Self-pay | Admitting: Family

## 2024-07-07 MED ORDER — METFORMIN HCL 1000 MG PO TABS: 1000.0000 mg | ORAL_TABLET | Freq: Two times a day (BID) | ORAL | 0 refills | Status: DC

## 2024-07-07 MED ORDER — GABAPENTIN 100 MG PO CAPS: ORAL_CAPSULE | ORAL | 0 refills | Status: AC

## 2024-07-07 NOTE — Telephone Encounter (Unsigned)
 Copied from CRM #[id number]. Topic: Clinical - Medication Refill >> Jul 07, 2024 12:16 PM Robinson H wrote: Medication: gabapentin  (NEURONTIN ) 100 MG capsule, metFORMIN  (GLUCOPHAGE ) 1000 MG tablet  Has the patient contacted their pharmacy? Yes, states pharmacy has sent a request no request on file (Agent: If no, request that the patient contact the pharmacy for the refill. If patient does not wish to contact the pharmacy document the reason why and proceed with request.) (Agent: If yes, when and what did the pharmacy advise?)  This is the patient's preferred pharmacy:  [facility] [address], [place] - [address] [facility] [place] [address] Phone: [phone] Fax: [phone]  Is this the correct pharmacy for this prescription? Yes If no, delete pharmacy and type the correct one.   Has the prescription been filled recently? No  Is the patient out of the medication? No  Has the patient been seen for an appointment in the last year OR does the patient have an upcoming appointment? Yes  Can we respond through MyChart? No  Agent: Please be advised that Rx refills may take up to 3 business days. We ask that you follow-up with your pharmacy.

## 2024-07-24 ENCOUNTER — Other Ambulatory Visit: Payer: Self-pay

## 2024-07-24 MED ORDER — LISINOPRIL 5 MG PO TABS: 5.0000 mg | ORAL_TABLET | Freq: Two times a day (BID) | ORAL | 1 refills | Status: AC

## 2024-08-21 ENCOUNTER — Other Ambulatory Visit: Payer: Self-pay | Admitting: Family

## 2024-08-21 NOTE — Telephone Encounter (Signed)
 Copied from CRM [id number]. Topic: Clinical - Medication Refill >> Aug 21, 2024 11:30 AM Amber H wrote: Medication: glimepiride  (AMARYL ) 2 MG tablet   Has the patient contacted their pharmacy? No, patients previous provider has left practice at the [facility] location. He has a TOC schd for March 2026 at the [facility] location.  (Agent: If no, request that the patient contact the pharmacy for the refill. If patient does not wish to contact the pharmacy document the reason why and proceed with request.) (Agent: If yes, when and what did the pharmacy advise?)  This is the patient's preferred pharmacy:  [facility] [address], [place] - [address] [facility] [place] [address] Phone: [phone] Fax: [phone]  Is this the correct pharmacy for this prescription? Yes If no, delete pharmacy and type the correct one.   Has the prescription been filled recently? Yes  Is the patient out of the medication? No, has 4 pills left.   Has the patient been seen for an appointment in the last year OR does the patient have an upcoming appointment? Yes, last seen 04/11/2024.  Can we respond through MyChart? No- wants phone call [phone]   Agent: Please be advised that Rx refills may take up to 3 business days. We ask that you follow-up with your pharmacy.

## 2024-08-22 MED ORDER — GLIMEPIRIDE 2 MG PO TABS: 2.0000 mg | ORAL_TABLET | Freq: Two times a day (BID) | ORAL | 0 refills | Status: DC

## 2024-08-22 MED ORDER — GLIMEPIRIDE 2 MG PO TABS: 2.0000 mg | ORAL_TABLET | Freq: Two times a day (BID) | ORAL | 0 refills | Status: AC

## 2024-08-22 NOTE — Telephone Encounter (Unsigned)
 Copied from CRM [id number]. Topic: General - Call Back - No Documentation >> Aug 22, 2024 12:06 PM Aleatha C wrote: Reason for CRM: Patient calling back about his   glimepiride  (AMARYL ) 2 MG tablet says its still isnt filled and he is completely out, he says he has made several attempts to get this filled

## 2024-08-22 NOTE — Telephone Encounter (Signed)
Rx resent again.

## 2024-08-22 NOTE — Telephone Encounter (Signed)
 Pharmacy called requesting to speak with someone about pts Glimepiride  2 MG refill. They reports that request was denied and pt is needing his medication asked Dr. Antonio Meth was it ok to refill the medication and she stated yes for 90 days until pt is able to get in with new PCP. Medication send into pharmacy.

## 2024-09-11 ENCOUNTER — Other Ambulatory Visit: Payer: Self-pay | Admitting: Family

## 2024-10-24 ENCOUNTER — Encounter: Admitting: Family Medicine
